# Patient Record
Sex: Male | Born: 1960
Health system: Southern US, Community
[De-identification: ages and names within clinical notes are randomized; demographics above are authoritative.]

## PROBLEM LIST (undated history)

## (undated) DIAGNOSIS — Z8619 Personal history of other infectious and parasitic diseases: Secondary | ICD-10-CM

## (undated) DIAGNOSIS — N200 Calculus of kidney: Secondary | ICD-10-CM

## (undated) DIAGNOSIS — F419 Anxiety disorder, unspecified: Secondary | ICD-10-CM

## (undated) DIAGNOSIS — B2790 Infectious mononucleosis, unspecified without complication: Secondary | ICD-10-CM

## (undated) DIAGNOSIS — M549 Dorsalgia, unspecified: Secondary | ICD-10-CM

## (undated) DIAGNOSIS — L409 Psoriasis, unspecified: Secondary | ICD-10-CM

## (undated) DIAGNOSIS — T7840XA Allergy, unspecified, initial encounter: Secondary | ICD-10-CM

## (undated) DIAGNOSIS — K8020 Calculus of gallbladder without cholecystitis without obstruction: Secondary | ICD-10-CM

## (undated) DIAGNOSIS — I10 Essential (primary) hypertension: Secondary | ICD-10-CM

## (undated) DIAGNOSIS — E785 Hyperlipidemia, unspecified: Secondary | ICD-10-CM

## (undated) DIAGNOSIS — R011 Cardiac murmur, unspecified: Secondary | ICD-10-CM

## (undated) HISTORY — DX: Dorsalgia, unspecified: M54.9

## (undated) HISTORY — PX: OTHER SURGICAL HISTORY: SHX169

## (undated) HISTORY — DX: Anxiety disorder, unspecified: F41.9

## (undated) HISTORY — DX: Calculus of gallbladder without cholecystitis without obstruction: K80.20

## (undated) HISTORY — DX: Cardiac murmur, unspecified: R01.1

## (undated) HISTORY — DX: Allergy, unspecified, initial encounter: T78.40XA

## (undated) HISTORY — DX: Calculus of kidney: N20.0

## (undated) HISTORY — DX: Hyperlipidemia, unspecified: E78.5

## (undated) HISTORY — DX: Essential (primary) hypertension: I10

## (undated) HISTORY — DX: Psoriasis, unspecified: L40.9

## (undated) HISTORY — DX: Infectious mononucleosis, unspecified without complication: B27.90

## (undated) HISTORY — DX: Personal history of other infectious and parasitic diseases: Z86.19

---
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
## 2001-04-03 ENCOUNTER — Encounter: Payer: Self-pay | Admitting: Family Medicine

## 2003-05-31 ENCOUNTER — Encounter: Payer: Self-pay | Admitting: Family Medicine

## 2003-05-31 LAB — CONVERTED CEMR LAB
LDL Cholesterol: 164.1 mg/dL
Triglycerides: 72 mg/dL

## 2004-12-14 ENCOUNTER — Emergency Department (HOSPITAL_COMMUNITY): Admission: EM | Admit: 2004-12-14 | Discharge: 2004-12-14 | Payer: Self-pay | Admitting: Family Medicine

## 2010-01-14 ENCOUNTER — Encounter: Payer: Self-pay | Admitting: Family Medicine

## 2010-01-14 DIAGNOSIS — J309 Allergic rhinitis, unspecified: Secondary | ICD-10-CM | POA: Insufficient documentation

## 2010-01-14 DIAGNOSIS — Z9189 Other specified personal risk factors, not elsewhere classified: Secondary | ICD-10-CM | POA: Insufficient documentation

## 2010-01-20 ENCOUNTER — Ambulatory Visit: Payer: Self-pay | Admitting: Family Medicine

## 2010-01-20 DIAGNOSIS — R635 Abnormal weight gain: Secondary | ICD-10-CM

## 2010-01-22 ENCOUNTER — Ambulatory Visit: Payer: Self-pay | Admitting: Family Medicine

## 2010-01-23 LAB — CONVERTED CEMR LAB
AST: 24 units/L (ref 0–37)
CO2: 27 meq/L (ref 19–32)
Chloride: 103 meq/L (ref 96–112)
Cholesterol: 191 mg/dL (ref 0–200)
Creatinine, Ser: 1 mg/dL (ref 0.4–1.5)
HDL: 34.6 mg/dL — ABNORMAL LOW (ref >39.00)
LDL Cholesterol: 129 mg/dL — ABNORMAL HIGH (ref 0–99)
Potassium: 4.6 meq/L (ref 3.5–5.1)
Sodium: 137 meq/L (ref 135–145)
TSH: 1.35 microintl units/mL (ref 0.35–5.50)
Triglycerides: 139 mg/dL (ref 0.0–149.0)

## 2010-05-19 NOTE — Assessment & Plan Note (Signed)
Summary: NEW PT TO EST/CLE   Vital Signs:  Patient profile:   50 year old male Height:      64 inches Weight:      223.75 pounds BMI:     38.55 Temp:     98.4 degrees F oral Pulse rate:   80 / minute Pulse rhythm:   regular BP sitting:   144 / 86  (left arm) Cuff size:   large  Vitals Entered By: Delilah Shan CMA Maeli Spacek Dull) (January 20, 2010 9:43 AM) CC: New Patient to Establish   History of Present Illness: New to clinic.  H/o allergic rhinitis.  Now off nasonex and doing well.  Taking allegra with relief.  Prev was on claritin.    Inc weight in spite of exercise and diet control.  Weight up about 16lbs in last year.  Asking for advice.  See plan.   Preventive Screening-Counseling & Management  Caffeine-Diet-Exercise     Does Patient Exercise: yes      Drug Use:  no.    Current Medications (verified): 1)  Allegra Allergy 180 Mg Tabs (Fexofenadine Hcl) .... Once Daily As Needed 2)  Aspirin 81 Mg  Tabs (Aspirin) .... Take 1 Tablet By Mouth Once A Day 3)  Multivitamins   Tabs (Multiple Vitamin) .... Take 1 Tablet By Mouth Once A Day  Allergies (verified): 1)  * Nasonex  Past History:  Past Medical History: Last updated: 01/14/2010 CHICKENPOX, HX OF (ICD-V15.9) ALLERGIC RHINITIS (ICD-477.9) 1981  Hospitalized for Mono  Family History: Last updated: 01/20/2010 Father: alive, h/o seizure-single event in distant past, HLD, possible early dementia, h/o cancer (throat) Mother: alive, HTN, arthritis Siblings: 2, healthy Paternal grandparents positive for HTN Maternal grandparents positive for MI < 65 No diabetes, prostate or colon cancer. 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  former Visual merchandiser  Past Surgical History: 1967  Tonsils and Adenoids  Family History: Reviewed history from 01/14/2010 and no changes required. Father: alive, h/o seizure-single event in distant past, HLD, possible early dementia, h/o cancer (throat) Mother: alive, HTN, arthritis Siblings: 2, healthy Paternal grandparents positive for HTN Maternal grandparents positive for MI < 65 No diabetes, prostate or colon cancer. Family History of Alcoholism/Addiction, grandparents Family History of Arthritis, parents, grandparents Family History Diabetes 1st degree relative, other blood relative Family History High cholesterol, parents, grandparents Family History Hypertension, parents Family History of Sudden Death, grandparents Family History of Heart Disease, grandparents  Social History: Reviewed history from 01/14/2010 and no changes required. 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  Otherwise negative.    Physical Exam  General:  GEN: nad, alert and oriented HEENT: mucous membranes moist,  nasal exam wnl NECK: supple w/o LA, no TMG CV: rrr.  no murmur PULM: ctab, no inc wob ABD: soft, +bs EXT: no edema SKIN: no acute rash    Impression & Recommendations:  Problem # 1:  WEIGHT GAIN (ICD-783.1) Check basic labs and contact patient.  Continue exercise and  incoporate more cardio.  Continue to work on diet.   Problem # 2:  ALLERGIC RHINITIS (ICD-477.9) No change in meds other than stopping steroid since he is well controlled off this.  The following medications were removed from the medication list:    Nasacort Aq 55 Mcg/act Aers (Triamcinolone acetonide) .Marland Kitchen... 2 sprays each nostril once daily His updated medication list for this problem includes:    Allegra Allergy 180 Mg Tabs (Fexofenadine hcl) ..... Once daily as needed  Complete Medication List: 1)  Allegra Allergy 180 Mg Tabs (Fexofenadine hcl) .... Once daily as needed 2)  Aspirin 81 Mg Tabs (Aspirin) .... Take 1 tablet by mouth once a day 3)  Multivitamins Tabs (Multiple vitamin) .... Take 1 tablet by mouth once a day  Patient Instructions: 1)  Please come back for fasting labs.  2)  cmet/lipid/TSH-----783.1 3)  You can get your results through our phone system.  Follow the instructions on the blue card.  4)  I would plan on getting a physical next year in the summer.  5)  Glad to see you.  Take care.  Keep exercising, especially cardio.  Prescriptions: ALLEGRA ALLERGY 180 MG TABS (FEXOFENADINE HCL) once daily as needed  #90 x 3   Entered and Authorized by:   Crawford Givens MD   Signed by:   Crawford Givens MD on 01/20/2010   Method used:   Faxed to ...       [facility] (retail)       [address]       [place], [place]  [address]       Ph: [phone]       Fax: [phone]   RxID:   [id number]   Current Allergies (reviewed today): * NASONEX

## 2010-08-24 ENCOUNTER — Ambulatory Visit (INDEPENDENT_AMBULATORY_CARE_PROVIDER_SITE_OTHER): Payer: BC Managed Care – PPO | Admitting: Family Medicine

## 2010-08-24 ENCOUNTER — Encounter: Payer: Self-pay | Admitting: Family Medicine

## 2010-08-24 DIAGNOSIS — R209 Unspecified disturbances of skin sensation: Secondary | ICD-10-CM

## 2010-08-24 DIAGNOSIS — R200 Anesthesia of skin: Secondary | ICD-10-CM

## 2010-08-24 DIAGNOSIS — J069 Acute upper respiratory infection, unspecified: Secondary | ICD-10-CM

## 2010-08-24 DIAGNOSIS — J029 Acute pharyngitis, unspecified: Secondary | ICD-10-CM

## 2010-08-24 NOTE — Assessment & Plan Note (Signed)
Pt to call ortho about fu on this.  I don't think this is related to the URI (and there is no indication of gillian barre).

## 2010-08-24 NOTE — Assessment & Plan Note (Addendum)
Benign exam.   Likely viral.  Should resolved.  Supportive tx and fu prn.  Nontoxic.  No indication for abx.

## 2010-08-24 NOTE — Progress Notes (Signed)
duration of symptoms: Last Monday had a sick exposure.  Dry cough since Thursday.  Sunday, the fever got worse along with chills.  Was some better this AM but then had some subjective fevers again.   rhinorrhea:no congestion:no ear pain:no sore throat:minimal, probably from the cough cough:yes Myalgias:yes No measured temps.  Had some nausea that got better with ginger ale.   He had some numbness in L foot.  He is going through acupuncture with ortho for his L hamstring pain.  ROS: See HPI.  Otherwise negative.    Meds, vitals, and allergies reviewed.   GEN: nad, alert and oriented HEENT: mucous membranes moist, TM w/o erythema, minimal L SOM noted, nasal epithelium not injected, OP with cobblestoning and mild erythema NECK: supple w/o LA CV: rrr. PULM: ctab, no inc wob ABD: soft, +bs EXT: no edema L foot with mild dec in sensation to light tough and monofilament but normal motor and vasc exam  RST negative.

## 2010-08-24 NOTE — Patient Instructions (Signed)
Call ortho about the numbness in your foot.  Drink plenty of fluids, take tylenol as needed, and gargle with warm salt water for your throat.  This should gradually improve.  Take care.  Let us know if you have other concerns.

## 2010-11-16 ENCOUNTER — Ambulatory Visit (INDEPENDENT_AMBULATORY_CARE_PROVIDER_SITE_OTHER): Payer: BC Managed Care – PPO | Admitting: Family Medicine

## 2010-11-16 ENCOUNTER — Encounter: Payer: Self-pay | Admitting: Family Medicine

## 2010-11-16 VITALS — BP 132/80 | HR 98 | Temp 98.2°F | Wt 204.0 lb

## 2010-11-16 DIAGNOSIS — M5432 Sciatica, left side: Secondary | ICD-10-CM

## 2010-11-16 DIAGNOSIS — M5430 Sciatica, unspecified side: Secondary | ICD-10-CM

## 2010-11-16 DIAGNOSIS — M549 Dorsalgia, unspecified: Secondary | ICD-10-CM

## 2010-11-16 MED ORDER — HYDROCODONE-ACETAMINOPHEN 5-500 MG PO TABS: 1.0000 | ORAL_TABLET | Freq: Four times a day (QID) | ORAL | Status: DC | PRN

## 2010-11-16 MED ORDER — IBUPROFEN 200 MG PO TABS: 400.0000 mg | ORAL_TABLET | Freq: Four times a day (QID) | ORAL | Status: AC | PRN

## 2010-11-16 NOTE — Patient Instructions (Signed)
Take 400-600mg  of ibuprofen 4 times a day with food.  Take the vicodin 4 times a day- it can make you drowsy.  Shirlee Limerick will call you about the MRI and the referral.   If you have a significant change in your symptoms, then notify the clinic (loss of bower/bladder control, profound weakness)

## 2010-11-16 NOTE — Assessment & Plan Note (Signed)
With sciatica and weakness in L foot.  DTRs are preserved in the BLE x2.  Refer for MRI and then eval at spine clinic.  Anatomy and rationale d/w pt.  He understood.

## 2010-11-16 NOTE — Progress Notes (Signed)
Back pain going on for about 4 months.  Was getting better but then it got worse when he felt something pop about 2 weeks ago. Had been going to chiropractor with some relief.  Had acupuncture done with some transient relief.  No trauma, falls, wrecks.  L foot has been numb, mostly in 1st toe- that was getting better until the pain increased again in last 2 weeks.  No weakness in legs noted by patient.  Pain shoots down L leg, not in R leg.  No bowel/bladder dysfunction.    He isn't better now than he was 2 weeks ago.    Meds, vitals, and allergies reviewed.   ROS: See HPI.  Otherwise, noncontributory.  nad ncat rrr ctab Back w/o midline pain. L hamstring tight with pos SLR  Quads not weak but he has weakness with plantar and dorsiflexion of the L great toe. Numbness on lateral L shin.  Distally with good cap refill.

## 2010-11-19 ENCOUNTER — Ambulatory Visit
Admission: RE | Admit: 2010-11-19 | Discharge: 2010-11-19 | Disposition: A | Discharge status: Home / Self Care | Payer: BC Managed Care – PPO | Source: Ambulatory Visit | Attending: Family Medicine | Admitting: Family Medicine

## 2010-11-19 DIAGNOSIS — M5432 Sciatica, left side: Secondary | ICD-10-CM

## 2010-11-23 ENCOUNTER — Other Ambulatory Visit: Payer: Self-pay | Admitting: *Deleted

## 2010-11-23 MED ORDER — HYDROCODONE-ACETAMINOPHEN 5-500 MG PO TABS
1.0000 | ORAL_TABLET | Freq: Four times a day (QID) | ORAL | Status: AC | PRN
Start: 2010-11-23 — End: 2010-12-03

## 2010-11-23 NOTE — Telephone Encounter (Signed)
Please call in

## 2010-11-24 NOTE — Telephone Encounter (Signed)
Rx called to pharmacy

## 2011-01-25 ENCOUNTER — Other Ambulatory Visit: Payer: Self-pay | Admitting: *Deleted

## 2011-01-25 NOTE — Telephone Encounter (Signed)
Please get up date from patient.  I didn't have recent correspondence from the spine clinic.  Then I can send the rx in.  Thanks.

## 2011-01-25 NOTE — Telephone Encounter (Signed)
Received faxed refill request.  This medication was not on meds list.  Added for refill purposes.

## 2011-01-25 NOTE — Telephone Encounter (Signed)
Patient said they told him that until he was pretty much debilitated, they cannot do surgery so he will likely have good and bad days until that time.  He says this is what you initially put him on before he went to the spine clinic.  He has an appt with you for a PE next week.

## 2011-01-26 ENCOUNTER — Other Ambulatory Visit: Payer: Self-pay | Admitting: *Deleted

## 2011-01-26 MED ORDER — HYDROCODONE-ACETAMINOPHEN 5-500 MG PO TABS: 1.0000 | ORAL_TABLET | Freq: Four times a day (QID) | ORAL | Status: DC | PRN

## 2011-01-26 NOTE — Telephone Encounter (Signed)
Medication phoned to pharmacy.  

## 2011-01-26 NOTE — Telephone Encounter (Signed)
Duplicate

## 2011-01-26 NOTE — Telephone Encounter (Signed)
Please call in.  Thanks.   

## 2011-01-30 ENCOUNTER — Other Ambulatory Visit: Payer: Self-pay | Admitting: Family Medicine

## 2011-01-30 DIAGNOSIS — R739 Hyperglycemia, unspecified: Secondary | ICD-10-CM

## 2011-02-01 ENCOUNTER — Other Ambulatory Visit (INDEPENDENT_AMBULATORY_CARE_PROVIDER_SITE_OTHER): Payer: BC Managed Care – PPO

## 2011-02-01 DIAGNOSIS — R739 Hyperglycemia, unspecified: Secondary | ICD-10-CM

## 2011-02-01 DIAGNOSIS — R7309 Other abnormal glucose: Secondary | ICD-10-CM

## 2011-02-01 LAB — LIPID PANEL
LDL Cholesterol: 131 mg/dL — ABNORMAL HIGH (ref 0–99)
Total CHOL/HDL Ratio: 5
VLDL: 25.4 mg/dL (ref 0.0–40.0)

## 2011-02-01 LAB — GLUCOSE, RANDOM: Glucose, Bld: 105 mg/dL — ABNORMAL HIGH (ref 70–99)

## 2011-02-05 ENCOUNTER — Encounter: Payer: Self-pay | Admitting: Gastroenterology

## 2011-02-05 ENCOUNTER — Encounter: Payer: Self-pay | Admitting: Family Medicine

## 2011-02-05 ENCOUNTER — Ambulatory Visit (INDEPENDENT_AMBULATORY_CARE_PROVIDER_SITE_OTHER): Payer: BC Managed Care – PPO | Admitting: Family Medicine

## 2011-02-05 VITALS — BP 142/82 | HR 83 | Temp 98.1°F | Wt 211.0 lb

## 2011-02-05 DIAGNOSIS — M549 Dorsalgia, unspecified: Secondary | ICD-10-CM

## 2011-02-05 DIAGNOSIS — Z0000 Encounter for general adult medical examination without abnormal findings: Secondary | ICD-10-CM

## 2011-02-05 DIAGNOSIS — Z1211 Encounter for screening for malignant neoplasm of colon: Secondary | ICD-10-CM

## 2011-02-05 MED ORDER — HYDROCODONE-ACETAMINOPHEN 5-500 MG PO TABS: 1.0000 | ORAL_TABLET | Freq: Four times a day (QID) | ORAL | Status: AC | PRN

## 2011-02-05 NOTE — Assessment & Plan Note (Signed)
PSA options were discussed along with recent recs.  No indication for psa at this point, since patient is low risk and there is no FH of prosate CA.  He declined testing of PSA.  Rectal exam reassuring.   D/w patient [id number] for colon cancer screening, including IFOB vs. colonoscopy.  Risks and benefits of both were discussed and patient voiced understanding.  Pt elects for: colonoscopy.  Will refer.  Flu shot done prev, td up to date.  D/w pt about labs and continue exercise, weight loss.  Minimal hyperglycemia.  He agrees.  Recheck next year.

## 2011-02-05 NOTE — Patient Instructions (Signed)
Take care, keep exercising and see Shirlee Limerick about your referral before your leave today.  Glad to see you today.

## 2011-02-05 NOTE — Assessment & Plan Note (Signed)
Improved, he has rx for vicodin if he has a flare.  He had f/u with spine clinic.  No other intervention now.

## 2011-02-05 NOTE — Progress Notes (Signed)
Back pain is much improved from prev, now with only occ flare. Takes vicodin, 1/2 tab, episodically with a flare.  Hasn't needed it recently.    CPE- See plan.  Routine anticipatory guidance given to patient.  See health maintenance.  PMH and SH reviewed  Meds, vitals, and allergies reviewed.   ROS: See HPI.  Otherwise negative.    GEN: nad, alert and oriented HEENT: mucous membranes moist NECK: supple w/o LA CV: rrr. PULM: ctab, no inc wob ABD: soft, +bs EXT: no edema SKIN: no acute rash Prostate gland firm and smooth, no enlargement, nodularity, tenderness, mass, asymmetry or induration.

## 2011-02-22 ENCOUNTER — Ambulatory Visit (AMBULATORY_SURGERY_CENTER): Payer: BC Managed Care – PPO | Admitting: *Deleted

## 2011-02-22 ENCOUNTER — Encounter: Payer: Self-pay | Admitting: Gastroenterology

## 2011-02-22 VITALS — Ht 64.0 in | Wt 212.0 lb

## 2011-02-22 DIAGNOSIS — Z1211 Encounter for screening for malignant neoplasm of colon: Secondary | ICD-10-CM

## 2011-02-22 MED ORDER — PEG-KCL-NACL-NASULF-NA ASC-C 100 G PO SOLR: 1.0000 | Freq: Once | ORAL | Status: DC

## 2011-03-08 ENCOUNTER — Encounter: Payer: Self-pay | Admitting: Gastroenterology

## 2011-03-08 ENCOUNTER — Ambulatory Visit (AMBULATORY_SURGERY_CENTER): Payer: BC Managed Care – PPO | Admitting: Gastroenterology

## 2011-03-08 DIAGNOSIS — K5730 Diverticulosis of large intestine without perforation or abscess without bleeding: Secondary | ICD-10-CM

## 2011-03-08 DIAGNOSIS — Z1211 Encounter for screening for malignant neoplasm of colon: Secondary | ICD-10-CM

## 2011-03-08 MED ORDER — SODIUM CHLORIDE 0.9 % IV SOLN: 500.0000 mL | INTRAVENOUS | Status: DC

## 2011-03-08 NOTE — Progress Notes (Signed)
Patient did not experience any of the following events: a burn prior to discharge; a fall within the facility; wrong site/side/patient/procedure/implant event; or a hospital transfer or hospital admission upon discharge from the facility. (G8907) Patient did not have preoperative order for IV antibiotic SSI prophylaxis. (G8918)  

## 2011-03-08 NOTE — Patient Instructions (Addendum)
Diverticulosis Diverticulosis is a common condition that develops when small pouches (diverticula) form in the wall of the colon. The risk of diverticulosis increases with age. It happens more often in people who eat a low-fiber diet. Most individuals with diverticulosis have no symptoms. Those individuals with symptoms usually experience abdominal pain, constipation, or loose stools (diarrhea). HOME CARE INSTRUCTIONS   Increase the amount of fiber in your diet as directed by your caregiver or dietician. This may reduce symptoms of diverticulosis.   Your caregiver may recommend taking a dietary fiber supplement.   Drink at least 6 to 8 glasses of water each day to prevent constipation.   Try not to strain when you have a bowel movement.   Your caregiver may recommend avoiding nuts and seeds to prevent complications, although this is still an uncertain benefit.   Only take over-the-counter or prescription medicines for pain, discomfort, or fever as directed by your caregiver.  FOODS WITH HIGH FIBER CONTENT INCLUDE:  Fruits. Apple, peach, pear, tangerine, raisins, prunes.   Vegetables. Brussels sprouts, asparagus, broccoli, cabbage, carrot, cauliflower, romaine lettuce, spinach, summer squash, tomato, winter squash, zucchini.   Starchy Vegetables. Baked beans, kidney beans, lima beans, split peas, lentils, potatoes (with skin).   Grains. Whole wheat bread, brown rice, bran flake cereal, plain oatmeal, white rice, shredded wheat, bran muffins.  SEEK IMMEDIATE MEDICAL CARE IF:   You develop increasing pain or severe bloating.   You have an oral temperature above 102 F (38.9 C), not controlled by medicine.   You develop vomiting or bowel movements that are bloody or black.  Document Released: 01/01/2004 Document Revised: 12/16/2010 Document Reviewed: 09/03/2009 [facility] Patient Information 2012 [place], [place].  Discharge instructions per blue and green sheets Handouts on  diverticulosis and high fiber diets

## 2011-03-09 ENCOUNTER — Telehealth: Payer: Self-pay | Admitting: *Deleted

## 2011-03-09 NOTE — Telephone Encounter (Signed)

## 2011-03-23 ENCOUNTER — Other Ambulatory Visit: Payer: Self-pay | Admitting: *Deleted

## 2011-03-23 MED ORDER — HYDROCODONE-ACETAMINOPHEN 5-500 MG PO TABS: 0.5000 | ORAL_TABLET | Freq: Four times a day (QID) | ORAL | Status: DC | PRN

## 2011-03-23 NOTE — Telephone Encounter (Signed)
Received faxed refill request from pharmacy. Is it okay to refill medication? 

## 2011-03-23 NOTE — Telephone Encounter (Signed)
Please call in

## 2011-03-24 NOTE — Telephone Encounter (Signed)
Medication phoned to pharmacy.  

## 2011-06-24 ENCOUNTER — Other Ambulatory Visit: Payer: Self-pay | Admitting: *Deleted

## 2011-06-25 MED ORDER — HYDROCODONE-ACETAMINOPHEN 5-500 MG PO TABS: 0.5000 | ORAL_TABLET | Freq: Four times a day (QID) | ORAL | Status: DC | PRN

## 2011-06-25 NOTE — Telephone Encounter (Signed)
[facility], patient notified via telephone. 

## 2011-06-25 NOTE — Telephone Encounter (Signed)
Please call in

## 2012-05-13 ENCOUNTER — Other Ambulatory Visit: Payer: Self-pay | Admitting: Family Medicine

## 2012-05-15 MED ORDER — HYDROCODONE-ACETAMINOPHEN 5-325 MG PO TABS: 0.5000 | ORAL_TABLET | Freq: Four times a day (QID) | ORAL | Status: DC | PRN

## 2012-05-15 NOTE — Telephone Encounter (Signed)
Needs to schedule a physical.  Please call in.

## 2012-05-15 NOTE — Telephone Encounter (Signed)
Electronic refill request.  ? If this strength is still available?  Please advise.

## 2012-05-16 NOTE — Telephone Encounter (Signed)
Medication phoned to pharmacy.  LMOVM to schedule CPE.

## 2012-07-03 ENCOUNTER — Other Ambulatory Visit: Payer: Self-pay | Admitting: Family Medicine

## 2012-07-03 DIAGNOSIS — R739 Hyperglycemia, unspecified: Secondary | ICD-10-CM

## 2012-07-04 ENCOUNTER — Other Ambulatory Visit (INDEPENDENT_AMBULATORY_CARE_PROVIDER_SITE_OTHER): Payer: BC Managed Care – PPO

## 2012-07-04 DIAGNOSIS — R7309 Other abnormal glucose: Secondary | ICD-10-CM

## 2012-07-04 DIAGNOSIS — R739 Hyperglycemia, unspecified: Secondary | ICD-10-CM

## 2012-07-04 LAB — LIPID PANEL
Cholesterol: 197 mg/dL (ref 0–200)
HDL: 34 mg/dL — ABNORMAL LOW (ref >39.00)
Total CHOL/HDL Ratio: 6
Triglycerides: 157 mg/dL — ABNORMAL HIGH (ref 0.0–149.0)

## 2012-07-04 LAB — COMPREHENSIVE METABOLIC PANEL
BUN: 17 mg/dL (ref 6–23)
CO2: 26 mEq/L (ref 19–32)
Calcium: 8.8 mg/dL (ref 8.4–10.5)
Creatinine, Ser: 1 mg/dL (ref 0.4–1.5)
GFR: 79.69 mL/min (ref >60.00)
Glucose, Bld: 113 mg/dL — ABNORMAL HIGH (ref 70–99)
Total Bilirubin: 1.1 mg/dL (ref 0.3–1.2)

## 2012-07-11 ENCOUNTER — Encounter: Payer: Self-pay | Admitting: Family Medicine

## 2012-07-11 ENCOUNTER — Ambulatory Visit (INDEPENDENT_AMBULATORY_CARE_PROVIDER_SITE_OTHER): Payer: BC Managed Care – PPO | Admitting: Family Medicine

## 2012-07-11 VITALS — BP 140/80 | HR 87 | Temp 98.6°F | Ht 64.0 in | Wt 219.0 lb

## 2012-07-11 DIAGNOSIS — M549 Dorsalgia, unspecified: Secondary | ICD-10-CM

## 2012-07-11 DIAGNOSIS — Z0000 Encounter for general adult medical examination without abnormal findings: Secondary | ICD-10-CM

## 2012-07-11 NOTE — Progress Notes (Signed)
CPE- See plan.  Routine anticipatory guidance given to patient.  See health maintenance. Tetanus 2009 Flu shot d/w pt.  Done fall 2013.  Colonoscopy 2012 Living will encouraged, d/w pt.  Would have wife designated if incapacitated.   Prostate cancer screening and PSA options (with potential risks and benefits of testing vs not testing) were discussed along with recent recs/guidelines.  He declined testing PSA at this point. Diet and exercise d/w pt.  Going to gym frequently.  Doing tai chi.  His weight is fluctuating.   Mild inc in sugar d/w pt.   Back pain. Taking aleve and occ vicodin.  Known lumbar pathology prev.  Some better with exercise.  He has occ flares.  He doesn't need refill on meds.   Psoriasis on hands and feet per derm.  Doing well.    PMH and SH reviewed  Meds, vitals, and allergies reviewed.   ROS: See HPI.  Otherwise negative.    GEN: nad, alert and oriented HEENT: mucous membranes moist NECK: supple w/o LA CV: rrr. PULM: ctab, no inc wob ABD: soft, +bs EXT: no edema SKIN: no acute rash but psoriatic plaques noted on palms.

## 2012-07-11 NOTE — Patient Instructions (Addendum)
Check diabetes.org about low carb diets.  Keep exercising.  Take care.   I would get a flu shot each fall.   Glad to see you.

## 2012-07-12 NOTE — Assessment & Plan Note (Signed)
Exam for back deferred today.  Doing well with exercise.  Continue as is.  Overall rare flares of pain.

## 2012-07-12 NOTE — Assessment & Plan Note (Signed)
Routine anticipatory guidance given to patient.  See health maintenance. Tetanus 2009 Flu shot d/w pt.  Done fall 2013.  Colonoscopy 2012 Living will encouraged, d/w pt.  Would have wife designated if incapacitated.   Prostate cancer screening and PSA options (with potential risks and benefits of testing vs not testing) were discussed along with recent recs/guidelines.  He declined testing PSA at this point. Diet and exercise d/w pt.  Going to gym frequently.  Doing tai chi.  His weight is fluctuating.   Mild inc in sugar d/w pt.  He'll work on diet.  Continue exercise. Recheck yearly.

## 2012-08-04 ENCOUNTER — Other Ambulatory Visit: Payer: Self-pay | Admitting: Family Medicine

## 2012-08-07 ENCOUNTER — Other Ambulatory Visit: Payer: Self-pay | Admitting: *Deleted

## 2012-08-07 MED ORDER — HYDROCODONE-ACETAMINOPHEN 5-325 MG PO TABS: 0.5000 | ORAL_TABLET | Freq: Four times a day (QID) | ORAL | Status: DC | PRN

## 2012-08-07 NOTE — Telephone Encounter (Signed)
Electronic refill request.  Please advise. 

## 2012-08-07 NOTE — Telephone Encounter (Signed)
Please call in.  Thanks.   

## 2012-08-07 NOTE — Telephone Encounter (Signed)
Faxed refill request.  Last filled 05/16/12  Please advise.

## 2012-08-08 NOTE — Telephone Encounter (Signed)
Rx phoned to pharmacy.  

## 2012-08-21 ENCOUNTER — Encounter: Payer: Self-pay | Admitting: Family Medicine

## 2012-08-21 LAB — GLUCOSE (CC13)
BUN: 22 mg/dL — AB (ref 4–21)
Creatinine, Ser: 0.96
Glucose: 93
HDL: 39 mg/dL (ref 35–70)
LDL Calculated: 121 mg/dL
PSA: 0.6
Uric Acid: 7.2

## 2012-08-25 ENCOUNTER — Encounter: Payer: Self-pay | Admitting: Family Medicine

## 2012-08-25 ENCOUNTER — Ambulatory Visit (INDEPENDENT_AMBULATORY_CARE_PROVIDER_SITE_OTHER): Payer: BC Managed Care – PPO | Admitting: Family Medicine

## 2012-08-25 VITALS — BP 124/70 | HR 87 | Temp 98.5°F | Wt 216.0 lb

## 2012-08-25 DIAGNOSIS — B079 Viral wart, unspecified: Secondary | ICD-10-CM

## 2012-08-25 LAB — HEPATIC FUNCTION PANEL
Albumin: 4.1 g/dL (ref 3.5–5.2)
Bilirubin, Direct: 0.1 mg/dL (ref 0.0–0.3)
Total Bilirubin: 0.8 mg/dL (ref 0.3–1.2)

## 2012-08-25 NOTE — Progress Notes (Signed)
F/u for LFT elevation at work. Had been taking occ vicodin for back aches.  No other med changes. No h/o liver troubles.  No h/o jaundice. No blood transfusions.  No IVDU.  No high risk habits.  He feels well.  No abd pain.  Prev LFTs were okay on checks here.  No NAVD.    Meds, vitals, and allergies reviewed.   ROS: See HPI.  Otherwise, noncontributory.  GEN: nad, alert and oriented HEENT: mucous membranes moist NECK: supple w/o LA CV: rrr.  PULM: ctab, no inc wob ABD: soft, +bs, not ttp in RUQ EXT: no edema SKIN: no acute rash but he does have a wart on the R 3rd finger.

## 2012-08-25 NOTE — Patient Instructions (Addendum)
Go to the lab on the way out.  We'll contact you with your lab report. Take care.  Glad to see you.  

## 2012-08-26 ENCOUNTER — Other Ambulatory Visit: Payer: Self-pay | Admitting: Family Medicine

## 2012-08-27 DIAGNOSIS — B079 Viral wart, unspecified: Secondary | ICD-10-CM | POA: Insufficient documentation

## 2012-08-27 NOTE — Assessment & Plan Note (Signed)
F/u LFTs are fine. Likely a false pos prev. No other w/u needed.

## 2012-08-27 NOTE — Assessment & Plan Note (Signed)
Treated x3 with liq N2 and tolerated well.  F/u prn.  Routine post liq N2 instructions given.

## 2012-08-28 ENCOUNTER — Encounter: Payer: Self-pay | Admitting: *Deleted

## 2012-08-28 NOTE — Telephone Encounter (Signed)
Please call in

## 2012-08-28 NOTE — Telephone Encounter (Signed)
Electronic refill request.  Please advise. 

## 2012-08-28 NOTE — Telephone Encounter (Signed)
Medication phoned to pharmacy.  

## 2013-01-31 ENCOUNTER — Other Ambulatory Visit: Payer: Self-pay

## 2013-01-31 MED ORDER — HYDROCODONE-ACETAMINOPHEN 5-325 MG PO TABS: ORAL_TABLET | ORAL | Status: DC

## 2013-01-31 NOTE — Telephone Encounter (Signed)
Patient notified that script is up front and ready for pickup. 

## 2013-01-31 NOTE — Telephone Encounter (Signed)
Printed.  Thanks.  

## 2013-01-31 NOTE — Telephone Encounter (Signed)
Pt request rx for hydrocodone apap for low back pain. Call when ready for pick up.

## 2013-02-01 ENCOUNTER — Encounter: Payer: Self-pay | Admitting: Family Medicine

## 2013-02-22 ENCOUNTER — Other Ambulatory Visit: Payer: Self-pay

## 2013-02-22 ENCOUNTER — Encounter: Payer: Self-pay | Admitting: Family Medicine

## 2013-06-27 ENCOUNTER — Encounter: Payer: Self-pay | Admitting: Family Medicine

## 2013-09-13 ENCOUNTER — Other Ambulatory Visit: Payer: BC Managed Care – PPO

## 2013-09-17 ENCOUNTER — Other Ambulatory Visit: Payer: BC Managed Care – PPO

## 2013-09-20 ENCOUNTER — Encounter: Payer: BC Managed Care – PPO | Admitting: Family Medicine

## 2013-09-24 ENCOUNTER — Ambulatory Visit (INDEPENDENT_AMBULATORY_CARE_PROVIDER_SITE_OTHER): Payer: BC Managed Care – PPO | Admitting: Family Medicine

## 2013-09-24 ENCOUNTER — Encounter: Payer: Self-pay | Admitting: Family Medicine

## 2013-09-24 VITALS — BP 130/74 | HR 71 | Temp 98.1°F | Ht 64.0 in | Wt 204.0 lb

## 2013-09-24 DIAGNOSIS — L409 Psoriasis, unspecified: Secondary | ICD-10-CM

## 2013-09-24 DIAGNOSIS — Z0000 Encounter for general adult medical examination without abnormal findings: Secondary | ICD-10-CM

## 2013-09-24 DIAGNOSIS — R002 Palpitations: Secondary | ICD-10-CM

## 2013-09-24 DIAGNOSIS — M549 Dorsalgia, unspecified: Secondary | ICD-10-CM

## 2013-09-24 NOTE — Patient Instructions (Signed)
Take care.  Your EKG looks fine.  Glad to see you.  Keep exercising.  If you have more palpitations then let me know.

## 2013-09-24 NOTE — Progress Notes (Signed)
Pre visit review using our clinic review tool, if applicable. No additional management support is needed unless otherwise documented below in the visit note.  CPE- See plan. Routine anticipatory guidance given to patient. See health maintenance.  Tetanus 2009  Flu shot d/w pt. Done fall 2014.  Colonoscopy 2012  Living will encouraged, d/w pt. Would have wife designated if incapacitated.  PSA wnl and reviewed.   Diet and exercise d/w pt. Going to gym frequently. Doing tai chi. His weight is down.  Labs d/w pt.   Back pain. Taking aleve and occ vicodin. Known lumbar pathology prev. Some better with exercise, a lot of core exercises and hamstring stretching.  He has occ flares. He doesn't need refill on meds.   Psoriasis on hands and feet, had seen derm and didn't respond to topical tx.  Didn't want systemic tx.  Discussed.    Palpitations.  Occ noted, rare sx.  No exertional sx, no CP, SOB, BLE edema.  Very limited duration, seconds.  Going on for about 1-2 years.  Not getting worse.   PMH and SH reviewed   Meds, vitals, and allergies reviewed.   ROS: See HPI. Otherwise negative.   GEN: nad, alert and oriented  HEENT: mucous membranes moist  NECK: supple w/o LA  CV: rrr.  PULM: ctab, no inc wob  ABD: soft, +bs  EXT: no edema  SKIN: no acute rash but psoriatic plaques noted on palms.

## 2013-09-26 DIAGNOSIS — R002 Palpitations: Secondary | ICD-10-CM | POA: Insufficient documentation

## 2013-09-26 DIAGNOSIS — L409 Psoriasis, unspecified: Secondary | ICD-10-CM | POA: Insufficient documentation

## 2013-09-26 NOTE — Assessment & Plan Note (Signed)
Routine anticipatory guidance given to patient. See health maintenance.  Tetanus 2009  Flu shot d/w pt. Done fall 2014.  Colonoscopy 2012  Living will encouraged, d/w pt. Would have wife designated if incapacitated.  PSA wnl and reviewed.   Diet and exercise d/w pt. Going to gym frequently. Doing tai chi. His weight is down.  Labs d/w pt.

## 2013-09-26 NOTE — Assessment & Plan Note (Signed)
Taking aleve and occ vicodin. Known lumbar pathology prev. Some better with exercise, a lot of core exercises and hamstring stretching. He has occ flares. He doesn't need refill on meds.  Continue as is.

## 2013-09-26 NOTE — Assessment & Plan Note (Signed)
Psoriasis on hands and feet, had seen derm and didn't respond to topical tx. Didn't want systemic tx. Discussed.  He'll continue as is.

## 2013-09-26 NOTE — Assessment & Plan Note (Signed)
Unremarkable EKG, d/w pt.  With very limited sx, going on for years likely w/o sig problems, then I wouldn't w/u at this point o/w.  Good exertional capacity and can notify me if other concerns.  He agrees.

## 2013-10-08 ENCOUNTER — Other Ambulatory Visit: Payer: Self-pay

## 2013-10-08 NOTE — Telephone Encounter (Signed)
Pt left v/m requesting rx hydrocodone apap. Call when ready for pick up.  

## 2013-10-09 MED ORDER — HYDROCODONE-ACETAMINOPHEN 5-325 MG PO TABS: ORAL_TABLET | ORAL | Status: DC

## 2013-10-09 NOTE — Telephone Encounter (Signed)
Pt notified Rx ready for pickup 

## 2013-10-09 NOTE — Telephone Encounter (Signed)
Printed, thanks

## 2014-04-19 ENCOUNTER — Encounter: Payer: Self-pay | Admitting: Family Medicine

## 2014-04-22 ENCOUNTER — Other Ambulatory Visit: Payer: Self-pay | Admitting: Family Medicine

## 2014-04-22 DIAGNOSIS — R002 Palpitations: Secondary | ICD-10-CM

## 2014-04-22 DIAGNOSIS — Z8249 Family history of ischemic heart disease and other diseases of the circulatory system: Secondary | ICD-10-CM

## 2014-04-26 ENCOUNTER — Other Ambulatory Visit (INDEPENDENT_AMBULATORY_CARE_PROVIDER_SITE_OTHER): Payer: BLUE CROSS/BLUE SHIELD

## 2014-04-26 DIAGNOSIS — Z79899 Other long term (current) drug therapy: Secondary | ICD-10-CM

## 2014-04-26 DIAGNOSIS — R002 Palpitations: Secondary | ICD-10-CM

## 2014-04-26 DIAGNOSIS — Z8249 Family history of ischemic heart disease and other diseases of the circulatory system: Secondary | ICD-10-CM

## 2014-04-26 LAB — CBC WITH DIFFERENTIAL/PLATELET
BASOS ABS: 0 10*3/uL (ref 0.0–0.1)
BASOS PCT: 0.7 % (ref 0.0–3.0)
Eosinophils Absolute: 0.1 10*3/uL (ref 0.0–0.7)
Eosinophils Relative: 1.9 % (ref 0.0–5.0)
HCT: 44.6 % (ref 39.0–52.0)
Hemoglobin: 15.1 g/dL (ref 13.0–17.0)
LYMPHS PCT: 26.3 % (ref 12.0–46.0)
Lymphs Abs: 1.7 10*3/uL (ref 0.7–4.0)
MCHC: 34 g/dL (ref 30.0–36.0)
MCV: 92.1 fl (ref 78.0–100.0)
MONO ABS: 0.5 10*3/uL (ref 0.1–1.0)
Monocytes Relative: 7 % (ref 3.0–12.0)
Neutro Abs: 4.2 10*3/uL (ref 1.4–7.7)
Neutrophils Relative %: 64.1 % (ref 43.0–77.0)
Platelets: 206 10*3/uL (ref 150.0–400.0)
RBC: 4.84 Mil/uL (ref 4.22–5.81)
RDW: 13.6 % (ref 11.5–15.5)
WBC: 6.5 10*3/uL (ref 4.0–10.5)

## 2014-04-26 LAB — COMPREHENSIVE METABOLIC PANEL
ALBUMIN: 4.3 g/dL (ref 3.5–5.2)
ALT: 25 U/L (ref 0–53)
AST: 19 U/L (ref 0–37)
Alkaline Phosphatase: 46 U/L (ref 39–117)
BILIRUBIN TOTAL: 1.1 mg/dL (ref 0.2–1.2)
BUN: 17 mg/dL (ref 6–23)
CO2: 27 mEq/L (ref 19–32)
Calcium: 9.2 mg/dL (ref 8.4–10.5)
Chloride: 105 mEq/L (ref 96–112)
Creatinine, Ser: 1 mg/dL (ref 0.4–1.5)
GFR: 81.86 mL/min (ref >60.00)
GLUCOSE: 109 mg/dL — AB (ref 70–99)
POTASSIUM: 4.5 meq/L (ref 3.5–5.1)
Sodium: 139 mEq/L (ref 135–145)
TOTAL PROTEIN: 7.3 g/dL (ref 6.0–8.3)

## 2014-04-26 LAB — LIPID PANEL
CHOL/HDL RATIO: 6
CHOLESTEROL: 241 mg/dL — AB (ref 0–200)
HDL: 38.8 mg/dL — AB (ref >39.00)
LDL Cholesterol: 181 mg/dL — ABNORMAL HIGH (ref 0–99)
NonHDL: 202.2
Triglycerides: 107 mg/dL (ref 0.0–149.0)
VLDL: 21.4 mg/dL (ref 0.0–40.0)

## 2014-04-26 LAB — TSH: TSH: 1.61 u[IU]/mL (ref 0.35–4.50)

## 2014-04-28 ENCOUNTER — Other Ambulatory Visit: Payer: Self-pay | Admitting: Family Medicine

## 2014-04-28 DIAGNOSIS — R7309 Other abnormal glucose: Secondary | ICD-10-CM

## 2014-04-28 DIAGNOSIS — E785 Hyperlipidemia, unspecified: Secondary | ICD-10-CM

## 2014-08-05 ENCOUNTER — Encounter: Payer: Self-pay | Admitting: Family Medicine

## 2014-08-05 LAB — GLUCOSE (CC13)
CHOLESTEROL, TOTAL: 187
Creatinine, Ser: 0.99 mg/dL (ref 0.50–1.10)
Glucose: 104
HDL: 46 mg/dL (ref 35–70)
LDL CALC: 123
PSA: 0.8
Triglycerides: 92
URIC ACID: 8.2

## 2014-08-28 ENCOUNTER — Encounter: Payer: Self-pay | Admitting: Family Medicine

## 2014-09-19 ENCOUNTER — Other Ambulatory Visit: Payer: Self-pay

## 2014-09-26 ENCOUNTER — Encounter: Payer: Self-pay | Admitting: Family Medicine

## 2014-10-03 ENCOUNTER — Encounter: Payer: Self-pay | Admitting: Family Medicine

## 2014-10-03 ENCOUNTER — Ambulatory Visit (INDEPENDENT_AMBULATORY_CARE_PROVIDER_SITE_OTHER): Payer: BLUE CROSS/BLUE SHIELD | Admitting: Family Medicine

## 2014-10-03 VITALS — BP 118/72 | HR 73 | Temp 98.8°F | Ht 64.0 in | Wt 203.0 lb

## 2014-10-03 DIAGNOSIS — Z0000 Encounter for general adult medical examination without abnormal findings: Secondary | ICD-10-CM | POA: Diagnosis not present

## 2014-10-03 DIAGNOSIS — L409 Psoriasis, unspecified: Secondary | ICD-10-CM

## 2014-10-03 DIAGNOSIS — M545 Low back pain: Secondary | ICD-10-CM

## 2014-10-03 MED ORDER — HYDROCODONE-ACETAMINOPHEN 5-325 MG PO TABS: ORAL_TABLET | ORAL | Status: DC

## 2014-10-03 NOTE — Progress Notes (Signed)
Pre visit review using our clinic review tool, if applicable. No additional management support is needed unless otherwise documented below in the visit note.  CPE- See plan.  Routine anticipatory guidance given to patient.  See health maintenance. Tetanus 2009  Flu shot done prev Colonoscopy 2012  Living will encouraged, d/w pt. Would have wife designated if incapacitated.  PSA wnl and reviewed.  Diet and exercise d/w pt. Going to gym frequently. Doing tai chi. His weight is down overall.   Labs d/w pt.   Psoriasis with joint involvement, skin and joint sx much improved with enbrel.     Back pain. Taking tumeric and occ vicodin. Known lumbar pathology prev. Some better with exercise, a lot of core exercises and hamstring stretching. He has occ flares. He needs refill on med.    PMH and SH reviewed  Meds, vitals, and allergies reviewed.   ROS: See HPI.  Otherwise negative.    GEN: nad, alert and oriented HEENT: mucous membranes moist NECK: supple w/o LA CV: rrr. PULM: ctab, no inc wob ABD: soft, +bs EXT: no edema SKIN: no acute rash

## 2014-10-03 NOTE — Patient Instructions (Signed)
Take care.  Glad to see you.  Recheck in about 1 year.   

## 2014-10-04 NOTE — Assessment & Plan Note (Signed)
With joint involvement, skin and joint sx much improved with enbrel.

## 2014-10-04 NOTE — Assessment & Plan Note (Signed)
Routine anticipatory guidance given to patient. See health maintenance.  Tetanus 2009  Flu shot done prev  Colonoscopy 2012  Living will encouraged, d/w pt. Would have wife designated if incapacitated.  PSA wnl and reviewed.  Diet and exercise d/w pt. Going to gym frequently. Doing tai chi. His weight is down overall.  Labs d/w pt.

## 2014-10-04 NOTE — Assessment & Plan Note (Signed)
Continue stretching and rare use of vicodin.  F/u prn.

## 2015-08-05 LAB — GLUCOSE (CC13)
ALT: 14
AST: 20 U/L
CREATININE: 0.9
Cholesterol, Total: 206
Glucose: 97
HDL Cholesterol: 47
LDL CALC: 129 mg/dL
Prostate Specific Ag, Serum: 0.8
Triglycerides: 150
URIC ACID: 7.9

## 2015-08-12 ENCOUNTER — Encounter: Payer: Self-pay | Admitting: Family Medicine

## 2015-08-12 DIAGNOSIS — L408 Other psoriasis: Secondary | ICD-10-CM | POA: Diagnosis not present

## 2015-08-28 DIAGNOSIS — Z79899 Other long term (current) drug therapy: Secondary | ICD-10-CM | POA: Diagnosis not present

## 2015-10-01 ENCOUNTER — Encounter: Payer: Self-pay | Admitting: Family Medicine

## 2015-10-05 ENCOUNTER — Other Ambulatory Visit: Payer: Self-pay | Admitting: Family Medicine

## 2015-10-05 DIAGNOSIS — R635 Abnormal weight gain: Secondary | ICD-10-CM

## 2015-10-09 ENCOUNTER — Other Ambulatory Visit (INDEPENDENT_AMBULATORY_CARE_PROVIDER_SITE_OTHER): Payer: BLUE CROSS/BLUE SHIELD

## 2015-10-09 DIAGNOSIS — R635 Abnormal weight gain: Secondary | ICD-10-CM

## 2015-10-09 LAB — CBC WITH DIFFERENTIAL/PLATELET
BASOS ABS: 0 10*3/uL (ref 0.0–0.1)
Basophils Relative: 0.9 % (ref 0.0–3.0)
Eosinophils Absolute: 0.1 10*3/uL (ref 0.0–0.7)
Eosinophils Relative: 1.9 % (ref 0.0–5.0)
HCT: 41.5 % (ref 39.0–52.0)
Hemoglobin: 14.5 g/dL (ref 13.0–17.0)
Lymphocytes Relative: 39.2 % (ref 12.0–46.0)
Lymphs Abs: 2 10*3/uL (ref 0.7–4.0)
MCHC: 34.8 g/dL (ref 30.0–36.0)
MCV: 89.9 fl (ref 78.0–100.0)
MONO ABS: 0.4 10*3/uL (ref 0.1–1.0)
Monocytes Relative: 8.3 % (ref 3.0–12.0)
NEUTROS ABS: 2.6 10*3/uL (ref 1.4–7.7)
NEUTROS PCT: 49.7 % (ref 43.0–77.0)
Platelets: 172 10*3/uL (ref 150.0–400.0)
RBC: 4.62 Mil/uL (ref 4.22–5.81)
RDW: 13.1 % (ref 11.5–15.5)
WBC: 5.2 10*3/uL (ref 4.0–10.5)

## 2015-10-09 LAB — COMPREHENSIVE METABOLIC PANEL
ALK PHOS: 37 U/L — AB (ref 39–117)
ALT: 15 U/L (ref 0–53)
AST: 17 U/L (ref 0–37)
Albumin: 4 g/dL (ref 3.5–5.2)
BUN: 16 mg/dL (ref 6–23)
CO2: 30 mEq/L (ref 19–32)
Calcium: 9.3 mg/dL (ref 8.4–10.5)
Chloride: 106 mEq/L (ref 96–112)
Creatinine, Ser: 1.05 mg/dL (ref 0.40–1.50)
GFR: 77.85 mL/min (ref >60.00)
Glucose, Bld: 103 mg/dL — ABNORMAL HIGH (ref 70–99)
POTASSIUM: 4.8 meq/L (ref 3.5–5.1)
Sodium: 135 mEq/L (ref 135–145)
TOTAL PROTEIN: 6.9 g/dL (ref 6.0–8.3)
Total Bilirubin: 0.9 mg/dL (ref 0.2–1.2)

## 2015-10-09 LAB — TSH: TSH: 1.36 u[IU]/mL (ref 0.35–4.50)

## 2015-10-09 LAB — LIPID PANEL
CHOL/HDL RATIO: 5
Cholesterol: 201 mg/dL — ABNORMAL HIGH (ref 0–200)
HDL: 41.3 mg/dL (ref >39.00)
LDL Cholesterol: 135 mg/dL — ABNORMAL HIGH (ref 0–99)
NONHDL: 159.4
Triglycerides: 120 mg/dL (ref 0.0–149.0)
VLDL: 24 mg/dL (ref 0.0–40.0)

## 2015-10-14 ENCOUNTER — Ambulatory Visit (INDEPENDENT_AMBULATORY_CARE_PROVIDER_SITE_OTHER): Payer: BLUE CROSS/BLUE SHIELD | Admitting: Family Medicine

## 2015-10-14 ENCOUNTER — Encounter: Payer: Self-pay | Admitting: Family Medicine

## 2015-10-14 VITALS — BP 116/70 | HR 69 | Temp 98.5°F | Ht 64.0 in | Wt 214.5 lb

## 2015-10-14 DIAGNOSIS — Z0000 Encounter for general adult medical examination without abnormal findings: Secondary | ICD-10-CM

## 2015-10-14 DIAGNOSIS — R635 Abnormal weight gain: Secondary | ICD-10-CM

## 2015-10-14 DIAGNOSIS — M545 Low back pain: Secondary | ICD-10-CM

## 2015-10-14 DIAGNOSIS — L409 Psoriasis, unspecified: Secondary | ICD-10-CM

## 2015-10-14 DIAGNOSIS — Z7189 Other specified counseling: Secondary | ICD-10-CM

## 2015-10-14 NOTE — Progress Notes (Signed)
Pre visit review using our clinic review tool, if applicable. No additional management support is needed unless otherwise documented below in the visit note.  CPE- See plan.  Routine anticipatory guidance given to patient.  See health maintenance. Tetanus 2009  Flu shot done prev PNA and shingles not due.   Colonoscopy 2012  Living will encouraged, d/w pt. Would have wife designated if incapacitated.  Prostate cancer screening and PSA options (with potential risks and benefits of testing vs not testing) were discussed along with recent recs/guidelines.  He declined testing PSA at this point. Diet and exercise d/w pt. Going to gym frequently. Doing tai chi and yoga.  Labs d/w pt.   Weight is up.  Has been calorie counting and trying to go vegetarian.  Weight had been variable over the years.  His weight has varied with his psoriasis over the years.  humira was started about 2 months ago.  Weight gain predates the humira.    Humira per [place] derm.  He is better with humira instead of enbrel.  The joint pain better in the meantime.  HIV and HCV likely done per derm prev before starting treatment.    Back pain. Taking tumeric and occ/rare vicodin. Known lumbar pathology prev. Some better with exercise, a lot of core exercises and hamstring stretching. He has occ flares. He didn't need refill on med.  Doing much better with yoga.  Using compression stockings in the meantime when working in the yard and doing well.    PMH and SH reviewed  Meds, vitals, and allergies reviewed.   ROS: Per HPI.  Unless specifically indicated otherwise in HPI, the patient denies:  General: fever. Eyes: acute vision changes ENT: sore throat Cardiovascular: chest pain Respiratory: SOB GI: vomiting GU: dysuria Musculoskeletal: acute back pain Derm: acute rash Neuro: acute motor dysfunction Psych: worsening mood Endocrine: polydipsia Heme: bleeding Allergy: hayfever  GEN: nad, alert and  oriented HEENT: mucous membranes moist NECK: supple w/o LA CV: rrr. PULM: ctab, no inc wob ABD: soft, +bs EXT: no edema SKIN: no acute rash but minimal psoriatic changes noted.

## 2015-10-14 NOTE — Patient Instructions (Addendum)
Check with dermatology to see if they ran a HIV and HCV test.   Take care.  Glad to see you.  Total your calories and try to cut about 5% Try to level out your calories in the meantime.

## 2015-10-15 DIAGNOSIS — R635 Abnormal weight gain: Secondary | ICD-10-CM | POA: Insufficient documentation

## 2015-10-15 DIAGNOSIS — Z7189 Other specified counseling: Secondary | ICD-10-CM | POA: Insufficient documentation

## 2015-10-15 NOTE — Assessment & Plan Note (Signed)
Tetanus 2009  Flu shot done prev PNA and shingles not due.   Colonoscopy 2012  Living will encouraged, d/w pt. Would have wife designated if incapacitated.  Prostate cancer screening and PSA options (with potential risks and benefits of testing vs not testing) were discussed along with recent recs/guidelines.  He declined testing PSA at this point. Diet and exercise d/w pt. Going to gym frequently. Doing tai chi and yoga.  Labs d/w pt.  HIV and HCV likely done per derm prev before starting treatment.  He'll check on that.

## 2015-10-15 NOTE — Assessment & Plan Note (Signed)
Labs unremarkable.   Total his calories and try to cut about 5% D/w pt rationale to level out the calories in the meantime, as that may help.  Prev with dinner as the biggest meal.   Update me as needed.  No sign of ominous dx.  D/w pt.  He agrees.

## 2015-10-15 NOTE — Assessment & Plan Note (Signed)
Improved with yoga. Continue as is.  Can refill meds later on if needed.  Doing well.

## 2015-10-15 NOTE — Assessment & Plan Note (Signed)
Per derm.  App input.

## 2016-07-27 LAB — GLUCOSE, RANDOM
ALT: 15
AST: 16 U/L
CHOLESTEROL, TOTAL: 225
CREATININE: 0.98
Glucose: 103
HDL Cholesterol: 45
Hemoglobin A1C: 5.2
Hemoglobin: 14
LDL Cholesterol (Calc): 146
Prostate Specific Ag, Serum: 0.8
TRIGLYCERIDES: 168
Uric Acid: 8.3

## 2016-08-12 DIAGNOSIS — L400 Psoriasis vulgaris: Secondary | ICD-10-CM | POA: Diagnosis not present

## 2016-08-12 DIAGNOSIS — Z79899 Other long term (current) drug therapy: Secondary | ICD-10-CM | POA: Diagnosis not present

## 2016-08-13 DIAGNOSIS — Z79899 Other long term (current) drug therapy: Secondary | ICD-10-CM | POA: Diagnosis not present

## 2016-08-16 LAB — QUANTIFERON TB GOLD ASSAY (BLOOD)
HCV Antibodies-RIBA: NONREACTIVE
HIV: NEGATIVE
QUANTIFERON TB GOLD: NEGATIVE

## 2016-08-26 ENCOUNTER — Encounter: Payer: Self-pay | Admitting: Family Medicine

## 2016-09-29 ENCOUNTER — Encounter: Payer: Self-pay | Admitting: Family Medicine

## 2016-10-18 ENCOUNTER — Other Ambulatory Visit: Payer: BLUE CROSS/BLUE SHIELD

## 2016-10-22 ENCOUNTER — Encounter: Payer: Self-pay | Admitting: Family Medicine

## 2016-10-22 ENCOUNTER — Ambulatory Visit (INDEPENDENT_AMBULATORY_CARE_PROVIDER_SITE_OTHER): Payer: BLUE CROSS/BLUE SHIELD | Admitting: Family Medicine

## 2016-10-22 VITALS — BP 126/72 | HR 83 | Temp 98.8°F | Ht 64.0 in | Wt 214.2 lb

## 2016-10-22 DIAGNOSIS — M545 Low back pain: Secondary | ICD-10-CM

## 2016-10-22 DIAGNOSIS — E785 Hyperlipidemia, unspecified: Secondary | ICD-10-CM

## 2016-10-22 DIAGNOSIS — Z0000 Encounter for general adult medical examination without abnormal findings: Secondary | ICD-10-CM

## 2016-10-22 MED ORDER — TRAMADOL HCL 50 MG PO TABS: 50.0000 mg | ORAL_TABLET | Freq: Three times a day (TID) | ORAL | 0 refills | Status: DC | PRN

## 2016-10-22 NOTE — Progress Notes (Signed)
CPE- See plan.  Routine anticipatory guidance given to patient.  See health maintenance.  The possibility exists that previously documented standard health maintenance information may have been brought forward from a previous encounter into this note.  If needed, that same information has been updated to reflect the current situation based on today's encounter.    Tetanus 2009  Flu shot done prev PNA and shingles not due.   Colonoscopy 2012  Living will encouraged, d/w pt. Would have wife designated if incapacitated.  PSA wnl 2018.  Diet and exercise d/w pt. Going to gym frequently. Doing tai chi and yoga.  Labs d/w pt. HLD noted, d/w pt about diet and exercise.  HIV and HCV done per derm prev before starting treatment.    Episodic flares of back pain.  Rare use of pain med, w/o ADE.  Still stretching and exercising at baseline to try to limit back sx in the first place.  D/w pt about trying to change to tramadol.  He agrees. Routine cautions d/w pt.   PMH and SH reviewed  Meds, vitals, and allergies reviewed.   ROS: Per HPI.  Unless specifically indicated otherwise in HPI, the patient denies:  General: fever. Eyes: acute vision changes ENT: sore throat Cardiovascular: chest pain Respiratory: SOB GI: vomiting GU: dysuria Musculoskeletal: acute back pain Derm: acute rash Neuro: acute motor dysfunction Psych: worsening mood Endocrine: polydipsia Heme: bleeding Allergy: hayfever  GEN: nad, alert and oriented HEENT: mucous membranes moist NECK: supple w/o LA CV: rrr. PULM: ctab, no inc wob ABD: soft, +bs EXT: no edema SKIN: no acute rash

## 2016-10-22 NOTE — Patient Instructions (Signed)
Try tramadol for back pain if needed (sedation caution) and keep stretching.  Take care.  Glad to see you.  Update me as needed.

## 2016-10-24 DIAGNOSIS — E785 Hyperlipidemia, unspecified: Secondary | ICD-10-CM | POA: Insufficient documentation

## 2016-10-24 NOTE — Assessment & Plan Note (Signed)
Rare flare overall. He is stretching and doing yoga. He can try tramadol to see if that is effective when needed. Routine cautions given. He agrees.

## 2016-10-24 NOTE — Assessment & Plan Note (Signed)
Labs d/w pt. HLD noted, d/w pt about diet and exercise.  Framingham score approximately 8%. This is likely low enough for him not to start a statin at this point, d/w pt.  We can recheck episodically.

## 2016-10-24 NOTE — Assessment & Plan Note (Signed)
Tetanus 2009  Flu shot done prev PNA and shingles not due.   Colonoscopy 2012  Living will encouraged, d/w pt. Would have wife designated if incapacitated.  PSA wnl 2018.  Diet and exercise d/w pt. Going to gym frequently. Doing tai chi and yoga.  Labs d/w pt. HLD noted, d/w pt about diet and exercise.  HIV and HCV done per derm prev before starting treatment.

## 2016-10-25 ENCOUNTER — Telehealth: Payer: Self-pay | Admitting: *Deleted

## 2016-10-25 NOTE — Telephone Encounter (Signed)
PA for Tramadol submitted and approved by [facility].  [facility] [place] notified.

## 2016-10-26 NOTE — Telephone Encounter (Signed)
PA for Tramadol approved 09/25/16 thru 10/25/17

## 2017-07-05 ENCOUNTER — Other Ambulatory Visit: Payer: Self-pay | Admitting: Family Medicine

## 2017-07-06 NOTE — Telephone Encounter (Signed)
Sent. Thanks.   

## 2017-07-06 NOTE — Telephone Encounter (Signed)
Electronic refill Last refill 10/22/16 #30 Last office visit 10/22/16

## 2017-10-09 ENCOUNTER — Encounter: Payer: Self-pay | Admitting: Family Medicine

## 2017-10-10 NOTE — Telephone Encounter (Signed)
Please advised if additional lab work is needed/ See message from patient.

## 2017-10-19 ENCOUNTER — Other Ambulatory Visit: Payer: BLUE CROSS/BLUE SHIELD

## 2017-10-24 ENCOUNTER — Ambulatory Visit (INDEPENDENT_AMBULATORY_CARE_PROVIDER_SITE_OTHER): Payer: BLUE CROSS/BLUE SHIELD | Admitting: Family Medicine

## 2017-10-24 ENCOUNTER — Encounter: Payer: Self-pay | Admitting: Family Medicine

## 2017-10-24 VITALS — BP 150/72 | HR 90 | Temp 98.5°F | Ht 64.0 in | Wt 221.0 lb

## 2017-10-24 DIAGNOSIS — Z7189 Other specified counseling: Secondary | ICD-10-CM

## 2017-10-24 DIAGNOSIS — Z23 Encounter for immunization: Secondary | ICD-10-CM

## 2017-10-24 DIAGNOSIS — Z0000 Encounter for general adult medical examination without abnormal findings: Secondary | ICD-10-CM

## 2017-10-24 DIAGNOSIS — M545 Low back pain: Secondary | ICD-10-CM

## 2017-10-24 DIAGNOSIS — K8020 Calculus of gallbladder without cholecystitis without obstruction: Secondary | ICD-10-CM

## 2017-10-24 DIAGNOSIS — E785 Hyperlipidemia, unspecified: Secondary | ICD-10-CM

## 2017-10-24 DIAGNOSIS — L409 Psoriasis, unspecified: Secondary | ICD-10-CM

## 2017-10-24 MED ORDER — ATORVASTATIN CALCIUM 10 MG PO TABS: 10.0000 mg | ORAL_TABLET | Freq: Every day | ORAL | 3 refills | Status: DC

## 2017-10-24 NOTE — Progress Notes (Signed)
CPE- See plan.  Routine anticipatory guidance given to patient.  See health maintenance.  The possibility exists that previously documented standard health maintenance information may have been brought forward from a previous encounter into this note.  If needed, that same information has been updated to reflect the current situation based on today's encounter.    Tetanus 2019  Flu shot done prev PNA and shingles d/w pt.   Colonoscopy 2012  Living will encouraged, d/w pt. Would have wife designated if incapacitated.  PSA wnl 2018.  d/w pt about deferring screening in 2019 given neg family FH and prev normal PSA.   Diet and exercise d/w pt. Going to gym frequently. Doing tai chi and yoga.  Labs d/w pt.  HIV and HCV done per derm prev before starting treatment.   Lipids d/w pt. ASCVD score 8.6.  D/w pt about diet/exercise/statin use and rationale.  Routine cautions given.  See plan.  Cholelithiasis incidentally noted on outside imaging.  No symptoms.  Discussed with patient.  No intervention needed unless he had symptoms.  Psoriasis controlled with meds.  No ADE on med. He has some occ aches, but no persistent pain.    Back pain flares treated with stretching and tai chi.  Using inversion table.    He has sig stressors with work and caring for his father.  D/w pt.  BP was lower on outside check prev, d/w pt.    PMH and SH reviewed  Meds, vitals, and allergies reviewed.   ROS: Per HPI.  Unless specifically indicated otherwise in HPI, the patient denies:  General: fever. Eyes: acute vision changes ENT: sore throat Cardiovascular: chest pain Respiratory: SOB GI: vomiting GU: dysuria Musculoskeletal: acute back pain Derm: acute rash Neuro: acute motor dysfunction Psych: worsening mood Endocrine: polydipsia Heme: bleeding Allergy: hayfever  GEN: nad, alert and oriented HEENT: mucous membranes moist NECK: supple w/o LA CV: rrr. PULM: ctab, no inc wob ABD: soft, +bs EXT:  no edema SKIN: no acute rash

## 2017-10-24 NOTE — Patient Instructions (Signed)
Start lipitor 10mg  a day.  If you have aches, stop it and then retry 1/2 tab a day after a break. Update me as needed.  If you can tolerate the medicine, then recheck labs in about 2 months, fasting.  Take care.  Glad to see you.

## 2017-10-25 ENCOUNTER — Encounter: Payer: Self-pay | Admitting: Family Medicine

## 2017-10-25 DIAGNOSIS — K8020 Calculus of gallbladder without cholecystitis without obstruction: Secondary | ICD-10-CM | POA: Insufficient documentation

## 2017-10-25 NOTE — Assessment & Plan Note (Signed)
Per outside clinic.  I will defer. 

## 2017-10-25 NOTE — Assessment & Plan Note (Signed)
Tetanus 2019  Flu shot done prev PNA and shingles d/w pt.   Colonoscopy 2012  Living will encouraged, d/w pt. Would have wife designated if incapacitated.  PSA wnl 2018.  d/w pt about deferring screening in 2019 given neg family FH and prev normal PSA.   Diet and exercise d/w pt. Going to gym frequently. Doing tai chi and yoga.  Labs d/w pt.  HIV and HCV done per derm prev before starting treatment.

## 2017-10-25 NOTE — Assessment & Plan Note (Signed)
No intervention needed since he does not have symptoms.  Discussed with patient.  Only was incidentally noted on outside imaging.

## 2017-10-25 NOTE — Assessment & Plan Note (Signed)
Continue stretching, etc.  Update me as needed.  He agrees.

## 2017-10-25 NOTE — Assessment & Plan Note (Signed)
Wife designated if patient were incapacitated.  

## 2017-10-25 NOTE — Assessment & Plan Note (Signed)
Discussed with patient about ASCVD score, approximately 8.6%.  At this point, reasonable to start statin.  Routine cautions given.  See after visit summary.  Start Lipitor 10 mg a day.  If he has aches then he will stop the medication and retry 5 mg a day.  Recheck labs in about 2 months.  He agrees.

## 2017-10-28 ENCOUNTER — Encounter: Payer: Self-pay | Admitting: Family Medicine

## 2017-10-28 LAB — LAB REPORT - SCANNED
A1c: 5
ALT: 15
AST: 16
CHOLESTEROL: 240
Creatinine, Ser: 1.02
GLUCOSE: 98
HDL: 48
Hemoglobin: 14.9
LDL (calc): 160
TRIGLYCERIDES: 160
TSH: 1.5

## 2018-01-02 ENCOUNTER — Other Ambulatory Visit (INDEPENDENT_AMBULATORY_CARE_PROVIDER_SITE_OTHER): Payer: BLUE CROSS/BLUE SHIELD

## 2018-01-02 DIAGNOSIS — E785 Hyperlipidemia, unspecified: Secondary | ICD-10-CM | POA: Diagnosis not present

## 2018-01-02 LAB — HEPATIC FUNCTION PANEL
ALBUMIN: 4.2 g/dL (ref 3.5–5.2)
ALK PHOS: 47 U/L (ref 39–117)
ALT: 20 U/L (ref 0–53)
AST: 17 U/L (ref 0–37)
BILIRUBIN TOTAL: 1.1 mg/dL (ref 0.2–1.2)
Bilirubin, Direct: 0.2 mg/dL (ref 0.0–0.3)
Total Protein: 7.2 g/dL (ref 6.0–8.3)

## 2018-01-02 LAB — LIPID PANEL
Cholesterol: 131 mg/dL (ref 0–200)
HDL: 39.6 mg/dL (ref >39.00)
LDL Cholesterol: 63 mg/dL (ref 0–99)
NONHDL: 91.7
Total CHOL/HDL Ratio: 3
Triglycerides: 143 mg/dL (ref 0.0–149.0)
VLDL: 28.6 mg/dL (ref 0.0–40.0)

## 2018-01-05 ENCOUNTER — Encounter: Payer: Self-pay | Admitting: *Deleted

## 2018-02-13 DIAGNOSIS — L400 Psoriasis vulgaris: Secondary | ICD-10-CM | POA: Diagnosis not present

## 2018-02-13 DIAGNOSIS — Z79899 Other long term (current) drug therapy: Secondary | ICD-10-CM | POA: Diagnosis not present

## 2018-05-16 DIAGNOSIS — L400 Psoriasis vulgaris: Secondary | ICD-10-CM | POA: Diagnosis not present

## 2018-05-16 DIAGNOSIS — L4050 Arthropathic psoriasis, unspecified: Secondary | ICD-10-CM | POA: Diagnosis not present

## 2018-05-16 DIAGNOSIS — Z79899 Other long term (current) drug therapy: Secondary | ICD-10-CM | POA: Diagnosis not present

## 2018-10-13 ENCOUNTER — Other Ambulatory Visit: Payer: Self-pay | Admitting: Family Medicine

## 2018-10-24 ENCOUNTER — Other Ambulatory Visit: Payer: Self-pay | Admitting: Family Medicine

## 2018-10-24 DIAGNOSIS — Z125 Encounter for screening for malignant neoplasm of prostate: Secondary | ICD-10-CM

## 2018-10-24 DIAGNOSIS — E785 Hyperlipidemia, unspecified: Secondary | ICD-10-CM

## 2018-10-25 ENCOUNTER — Other Ambulatory Visit (INDEPENDENT_AMBULATORY_CARE_PROVIDER_SITE_OTHER): Payer: BC Managed Care – PPO

## 2018-10-25 ENCOUNTER — Other Ambulatory Visit: Payer: Self-pay

## 2018-10-25 DIAGNOSIS — E785 Hyperlipidemia, unspecified: Secondary | ICD-10-CM | POA: Diagnosis not present

## 2018-10-25 DIAGNOSIS — Z125 Encounter for screening for malignant neoplasm of prostate: Secondary | ICD-10-CM | POA: Diagnosis not present

## 2018-10-25 LAB — COMPREHENSIVE METABOLIC PANEL
ALT: 14 U/L (ref 0–53)
AST: 15 U/L (ref 0–37)
Albumin: 4.2 g/dL (ref 3.5–5.2)
Alkaline Phosphatase: 51 U/L (ref 39–117)
BUN: 14 mg/dL (ref 6–23)
CO2: 27 mEq/L (ref 19–32)
Calcium: 8.9 mg/dL (ref 8.4–10.5)
Chloride: 104 mEq/L (ref 96–112)
Creatinine, Ser: 1.08 mg/dL (ref 0.40–1.50)
GFR: 70.13 mL/min (ref >60.00)
Glucose, Bld: 102 mg/dL — ABNORMAL HIGH (ref 70–99)
Potassium: 5.2 mEq/L — ABNORMAL HIGH (ref 3.5–5.1)
Sodium: 139 mEq/L (ref 135–145)
Total Bilirubin: 1.3 mg/dL — ABNORMAL HIGH (ref 0.2–1.2)
Total Protein: 6.8 g/dL (ref 6.0–8.3)

## 2018-10-25 LAB — PSA: PSA: 0.63 ng/mL (ref 0.10–4.00)

## 2018-10-25 LAB — LIPID PANEL
Cholesterol: 134 mg/dL (ref 0–200)
HDL: 37.5 mg/dL — ABNORMAL LOW (ref >39.00)
LDL Cholesterol: 68 mg/dL (ref 0–99)
NonHDL: 96.02
Total CHOL/HDL Ratio: 4
Triglycerides: 139 mg/dL (ref 0.0–149.0)
VLDL: 27.8 mg/dL (ref 0.0–40.0)

## 2018-10-30 ENCOUNTER — Encounter: Payer: Self-pay | Admitting: Family Medicine

## 2018-10-30 ENCOUNTER — Other Ambulatory Visit: Payer: Self-pay

## 2018-10-30 ENCOUNTER — Ambulatory Visit (INDEPENDENT_AMBULATORY_CARE_PROVIDER_SITE_OTHER): Payer: BC Managed Care – PPO | Admitting: Family Medicine

## 2018-10-30 ENCOUNTER — Ambulatory Visit (INDEPENDENT_AMBULATORY_CARE_PROVIDER_SITE_OTHER)
Admission: RE | Admit: 2018-10-30 | Discharge: 2018-10-30 | Disposition: A | Discharge status: Home / Self Care | Payer: BC Managed Care – PPO | Source: Ambulatory Visit | Attending: Family Medicine | Admitting: Family Medicine

## 2018-10-30 VITALS — BP 148/82 | HR 71 | Temp 98.2°F | Ht 64.0 in | Wt 226.6 lb

## 2018-10-30 DIAGNOSIS — Z0000 Encounter for general adult medical examination without abnormal findings: Secondary | ICD-10-CM

## 2018-10-30 DIAGNOSIS — M25559 Pain in unspecified hip: Secondary | ICD-10-CM

## 2018-10-30 DIAGNOSIS — L409 Psoriasis, unspecified: Secondary | ICD-10-CM

## 2018-10-30 DIAGNOSIS — E875 Hyperkalemia: Secondary | ICD-10-CM | POA: Diagnosis not present

## 2018-10-30 DIAGNOSIS — M25551 Pain in right hip: Secondary | ICD-10-CM | POA: Diagnosis not present

## 2018-10-30 DIAGNOSIS — M1990 Unspecified osteoarthritis, unspecified site: Secondary | ICD-10-CM | POA: Diagnosis not present

## 2018-10-30 DIAGNOSIS — M25511 Pain in right shoulder: Secondary | ICD-10-CM | POA: Diagnosis not present

## 2018-10-30 DIAGNOSIS — E785 Hyperlipidemia, unspecified: Secondary | ICD-10-CM

## 2018-10-30 DIAGNOSIS — Z7189 Other specified counseling: Secondary | ICD-10-CM

## 2018-10-30 DIAGNOSIS — M25519 Pain in unspecified shoulder: Secondary | ICD-10-CM | POA: Diagnosis not present

## 2018-10-30 LAB — POTASSIUM: Potassium: 4.6 mEq/L (ref 3.5–5.1)

## 2018-10-30 NOTE — Progress Notes (Signed)
CPE- See plan.  Routine anticipatory guidance given to patient.  See health maintenance.  The possibility exists that previously documented standard health maintenance information may have been brought forward from a previous encounter into this note.  If needed, that same information has been updated to reflect the current situation based on today's encounter.    Tetanus 2019  Flu shot done prev PNA and shingles d/w pt.   Colonoscopy 2012.  FH colon cancer, mother dx'd at 27.   Living will encouraged, d/w pt. Would have wife designated if patient were incapacitated.  PSA wnl 2020 Diet and exercise d/w pt.  Doing tai chi and yoga.  Labs d/w pt.  HIV and HCV done per derm prev before starting treatment.  Elevated Cholesterol: Using medications without problems: yes Muscle aches: see below.   Diet compliance: yes Exercise: yes Labs d/w pt.   Psoriasis with possible psoriatic arthritis.  He has more hip pain in the meantime.  He had been taking ibuprofen for shoulder and hip pain.  We talked about rheum eval.  His hand pain gets better with stelara but not shoulder and hip pain.  R shoulder is more painful than R hip, but he has h/o R shoulder injury from years ago.  Pain on R side at night.  nsaid cautions d/w pt.    Mildly inc K noted, d/w pt. repeat pending.  His father died last year, condolences offered.   PMH and SH reviewed  Meds, vitals, and allergies reviewed.   ROS: Per HPI.  Unless specifically indicated otherwise in HPI, the patient denies:  General: fever. Eyes: acute vision changes ENT: sore throat Cardiovascular: chest pain Respiratory: SOB GI: vomiting GU: dysuria Musculoskeletal: acute back pain Derm: acute rash Neuro: acute motor dysfunction Psych: worsening mood Endocrine: polydipsia Heme: bleeding Allergy: hayfever  GEN: nad, alert and oriented HEENT: mucous membranes moist NECK: supple w/o LA CV: rrr. PULM: ctab, no inc wob ABD: soft,  +bs EXT: no edema SKIN: no acute rash but small psoriatic plaque near R thumb.  Normal R should ROM and cuff testing.  R hip with pain on int rotation.

## 2018-10-30 NOTE — Patient Instructions (Addendum)
We make arrangements for referrals, extra imaging, and other appointments based on the urgency of the situation. Referrals are handled based on the clinical situation, not in the order that they are placed. If you do not see one of our referral coordinators on the way out of the clinic today, then you should expect a call in the next 1 to 2 weeks. We work diligently to process all referrals as quickly as possible.    Stop the lipitor for 2 weeks and update me about the joint aches.   Go to the lab on the way out.  We'll contact you with your lab and xray report. Take care.  Glad to see you.

## 2018-10-31 ENCOUNTER — Encounter: Payer: Self-pay | Admitting: Family Medicine

## 2018-11-01 ENCOUNTER — Telehealth: Payer: Self-pay | Admitting: Family Medicine

## 2018-11-01 NOTE — Assessment & Plan Note (Signed)
Living will encouraged, d/w pt. Would have wife designated if patient were incapacitated.   °

## 2018-11-01 NOTE — Assessment & Plan Note (Signed)
Tetanus 2019  Flu shot done prev PNA and shingles d/w pt.   Colonoscopy 2012.  FH colon cancer, mother dx'd at 38.   Living will encouraged, d/w pt. Would have wife designated if patient were incapacitated.  PSA wnl 2020 Diet and exercise d/w pt.  Doing tai chi and yoga.  Labs d/w pt.  HIV and HCV done per derm prev before starting treatment.

## 2018-11-01 NOTE — Assessment & Plan Note (Signed)
He will stop the lipitor for 2 weeks and update me about the joint aches.  Unclear how much that contributes to his current joint aches.

## 2018-11-01 NOTE — Assessment & Plan Note (Signed)
Unclear how much of his joint aches are related to psoriatic arthritis.  Reasonable to check plain films today and refer to rheumatology.  He agrees.

## 2018-11-01 NOTE — Telephone Encounter (Signed)
Call patient.  I checked on the guidelines about colon cancer screening.  His mother was diagnosed at 39. For screening purposes, patients with one first-degree relative diagnosed with colorectal cancer or advanced adenoma at age 58 years or older are considered at average risk.  This should be the case for him.  That means 10-year screening would still be reasonable.  Thanks.

## 2018-11-02 NOTE — Telephone Encounter (Signed)
Patient advised.

## 2018-11-15 DIAGNOSIS — Z79899 Other long term (current) drug therapy: Secondary | ICD-10-CM | POA: Diagnosis not present

## 2018-11-15 DIAGNOSIS — L400 Psoriasis vulgaris: Secondary | ICD-10-CM | POA: Diagnosis not present

## 2018-11-15 DIAGNOSIS — L4050 Arthropathic psoriasis, unspecified: Secondary | ICD-10-CM | POA: Diagnosis not present

## 2018-11-17 DIAGNOSIS — L4059 Other psoriatic arthropathy: Secondary | ICD-10-CM | POA: Diagnosis not present

## 2018-11-17 DIAGNOSIS — M2550 Pain in unspecified joint: Secondary | ICD-10-CM | POA: Diagnosis not present

## 2018-11-17 DIAGNOSIS — Z6839 Body mass index (BMI) 39.0-39.9, adult: Secondary | ICD-10-CM | POA: Diagnosis not present

## 2018-11-17 LAB — LATEX, IGE: RA Latex Turbid.: <10

## 2018-11-17 LAB — SED RATE MANUAL WEST: SED RATE BY MODIFIED WESTERGREN,MANUAL: 6

## 2018-11-17 LAB — C-REACTIVE PROTEIN: CRP: 2

## 2018-11-17 LAB — CYCLIC CITRUL PEPTIDE ANTIBODY, IGG: CCP Antibodies IgG/IgA: 5

## 2018-11-18 ENCOUNTER — Encounter: Payer: Self-pay | Admitting: Family Medicine

## 2018-11-20 ENCOUNTER — Encounter: Payer: Self-pay | Admitting: Family Medicine

## 2018-11-20 ENCOUNTER — Other Ambulatory Visit: Payer: Self-pay | Admitting: Family Medicine

## 2019-03-06 DIAGNOSIS — L814 Other melanin hyperpigmentation: Secondary | ICD-10-CM | POA: Diagnosis not present

## 2019-03-06 DIAGNOSIS — L820 Inflamed seborrheic keratosis: Secondary | ICD-10-CM | POA: Diagnosis not present

## 2019-03-06 DIAGNOSIS — D485 Neoplasm of uncertain behavior of skin: Secondary | ICD-10-CM | POA: Diagnosis not present

## 2019-03-06 DIAGNOSIS — L821 Other seborrheic keratosis: Secondary | ICD-10-CM | POA: Diagnosis not present

## 2019-03-06 DIAGNOSIS — L400 Psoriasis vulgaris: Secondary | ICD-10-CM | POA: Diagnosis not present

## 2019-03-06 DIAGNOSIS — D2272 Melanocytic nevi of left lower limb, including hip: Secondary | ICD-10-CM | POA: Diagnosis not present

## 2019-05-17 DIAGNOSIS — L409 Psoriasis, unspecified: Secondary | ICD-10-CM | POA: Diagnosis not present

## 2019-05-17 DIAGNOSIS — L4059 Other psoriatic arthropathy: Secondary | ICD-10-CM | POA: Diagnosis not present

## 2019-05-30 DIAGNOSIS — L400 Psoriasis vulgaris: Secondary | ICD-10-CM | POA: Diagnosis not present

## 2019-05-30 DIAGNOSIS — Z23 Encounter for immunization: Secondary | ICD-10-CM | POA: Diagnosis not present

## 2019-05-30 DIAGNOSIS — Z79899 Other long term (current) drug therapy: Secondary | ICD-10-CM | POA: Diagnosis not present

## 2019-07-22 ENCOUNTER — Encounter: Payer: Self-pay | Admitting: Family Medicine

## 2019-07-23 NOTE — Telephone Encounter (Signed)
Dr. Para March, please let him know that his insurance will not cover this medication and it is not cheap.  I will not be doing a PA on it.

## 2019-07-25 ENCOUNTER — Other Ambulatory Visit: Payer: Self-pay | Admitting: Family Medicine

## 2019-07-25 MED ORDER — SILDENAFIL CITRATE 20 MG PO TABS: 20.0000 mg | ORAL_TABLET | Freq: Every day | ORAL | 2 refills | Status: DC | PRN

## 2019-09-13 DIAGNOSIS — M25511 Pain in right shoulder: Secondary | ICD-10-CM | POA: Diagnosis not present

## 2019-09-13 DIAGNOSIS — M545 Low back pain: Secondary | ICD-10-CM | POA: Diagnosis not present

## 2019-09-13 DIAGNOSIS — R269 Unspecified abnormalities of gait and mobility: Secondary | ICD-10-CM | POA: Diagnosis not present

## 2019-09-20 DIAGNOSIS — M545 Low back pain: Secondary | ICD-10-CM | POA: Diagnosis not present

## 2019-09-20 DIAGNOSIS — M25511 Pain in right shoulder: Secondary | ICD-10-CM | POA: Diagnosis not present

## 2019-09-20 DIAGNOSIS — R269 Unspecified abnormalities of gait and mobility: Secondary | ICD-10-CM | POA: Diagnosis not present

## 2019-09-25 DIAGNOSIS — M545 Low back pain: Secondary | ICD-10-CM | POA: Diagnosis not present

## 2019-09-25 DIAGNOSIS — R269 Unspecified abnormalities of gait and mobility: Secondary | ICD-10-CM | POA: Diagnosis not present

## 2019-09-25 DIAGNOSIS — M25511 Pain in right shoulder: Secondary | ICD-10-CM | POA: Diagnosis not present

## 2019-09-27 DIAGNOSIS — M545 Low back pain: Secondary | ICD-10-CM | POA: Diagnosis not present

## 2019-09-27 DIAGNOSIS — R269 Unspecified abnormalities of gait and mobility: Secondary | ICD-10-CM | POA: Diagnosis not present

## 2019-09-27 DIAGNOSIS — M25511 Pain in right shoulder: Secondary | ICD-10-CM | POA: Diagnosis not present

## 2019-10-05 DIAGNOSIS — R269 Unspecified abnormalities of gait and mobility: Secondary | ICD-10-CM | POA: Diagnosis not present

## 2019-10-05 DIAGNOSIS — M545 Low back pain: Secondary | ICD-10-CM | POA: Diagnosis not present

## 2019-10-05 DIAGNOSIS — M25511 Pain in right shoulder: Secondary | ICD-10-CM | POA: Diagnosis not present

## 2019-10-22 ENCOUNTER — Other Ambulatory Visit: Payer: Self-pay | Admitting: Family Medicine

## 2019-10-22 DIAGNOSIS — Z125 Encounter for screening for malignant neoplasm of prostate: Secondary | ICD-10-CM

## 2019-10-22 DIAGNOSIS — E786 Lipoprotein deficiency: Secondary | ICD-10-CM

## 2019-10-26 ENCOUNTER — Other Ambulatory Visit: Payer: Self-pay

## 2019-10-26 ENCOUNTER — Other Ambulatory Visit (INDEPENDENT_AMBULATORY_CARE_PROVIDER_SITE_OTHER): Payer: BC Managed Care – PPO

## 2019-10-26 DIAGNOSIS — E786 Lipoprotein deficiency: Secondary | ICD-10-CM | POA: Diagnosis not present

## 2019-10-26 DIAGNOSIS — Z125 Encounter for screening for malignant neoplasm of prostate: Secondary | ICD-10-CM | POA: Diagnosis not present

## 2019-10-26 LAB — COMPREHENSIVE METABOLIC PANEL
ALT: 13 U/L (ref 0–53)
AST: 14 U/L (ref 0–37)
Albumin: 4.2 g/dL (ref 3.5–5.2)
Alkaline Phosphatase: 45 U/L (ref 39–117)
BUN: 10 mg/dL (ref 6–23)
CO2: 26 mEq/L (ref 19–32)
Calcium: 9 mg/dL (ref 8.4–10.5)
Chloride: 103 mEq/L (ref 96–112)
Creatinine, Ser: 0.97 mg/dL (ref 0.40–1.50)
GFR: 79.12 mL/min (ref >60.00)
Glucose, Bld: 102 mg/dL — ABNORMAL HIGH (ref 70–99)
Potassium: 4.4 mEq/L (ref 3.5–5.1)
Sodium: 137 mEq/L (ref 135–145)
Total Bilirubin: 1 mg/dL (ref 0.2–1.2)
Total Protein: 6.6 g/dL (ref 6.0–8.3)

## 2019-10-26 LAB — LIPID PANEL
Cholesterol: 183 mg/dL (ref 0–200)
HDL: 41.1 mg/dL (ref >39.00)
LDL Cholesterol: 111 mg/dL — ABNORMAL HIGH (ref 0–99)
NonHDL: 141.97
Total CHOL/HDL Ratio: 4
Triglycerides: 153 mg/dL — ABNORMAL HIGH (ref 0.0–149.0)
VLDL: 30.6 mg/dL (ref 0.0–40.0)

## 2019-10-26 LAB — PSA: PSA: 0.62 ng/mL (ref 0.10–4.00)

## 2019-10-30 DIAGNOSIS — M545 Low back pain: Secondary | ICD-10-CM | POA: Diagnosis not present

## 2019-10-30 DIAGNOSIS — R269 Unspecified abnormalities of gait and mobility: Secondary | ICD-10-CM | POA: Diagnosis not present

## 2019-10-30 DIAGNOSIS — M25511 Pain in right shoulder: Secondary | ICD-10-CM | POA: Diagnosis not present

## 2019-11-02 ENCOUNTER — Encounter: Payer: BC Managed Care – PPO | Admitting: Family Medicine

## 2019-11-05 ENCOUNTER — Encounter: Payer: Self-pay | Admitting: Family Medicine

## 2019-11-05 ENCOUNTER — Ambulatory Visit (INDEPENDENT_AMBULATORY_CARE_PROVIDER_SITE_OTHER): Payer: BC Managed Care – PPO | Admitting: Family Medicine

## 2019-11-05 ENCOUNTER — Other Ambulatory Visit: Payer: Self-pay

## 2019-11-05 VITALS — BP 136/72 | HR 94 | Temp 98.0°F | Ht 64.0 in | Wt 229.6 lb

## 2019-11-05 DIAGNOSIS — L409 Psoriasis, unspecified: Secondary | ICD-10-CM

## 2019-11-05 DIAGNOSIS — Z0000 Encounter for general adult medical examination without abnormal findings: Secondary | ICD-10-CM

## 2019-11-05 DIAGNOSIS — Z7189 Other specified counseling: Secondary | ICD-10-CM

## 2019-11-05 DIAGNOSIS — N529 Male erectile dysfunction, unspecified: Secondary | ICD-10-CM

## 2019-11-05 DIAGNOSIS — E785 Hyperlipidemia, unspecified: Secondary | ICD-10-CM

## 2019-11-05 NOTE — Patient Instructions (Signed)
Keep working on diet and exercise.  Update me as needed.  Take care.  Glad to see you. 

## 2019-11-05 NOTE — Progress Notes (Signed)
This visit occurred during the SARS-CoV-2 public health emergency.  Safety protocols were in place, including screening questions prior to the visit, additional usage of staff PPE, and extensive cleaning of exam room while observing appropriate contact time as indicated for disinfecting solutions.  CPE- See plan.  Routine anticipatory guidance given to patient.  See health maintenance.  The possibility exists that previously documented standard health maintenance information may have been brought forward from a previous encounter into this note.  If needed, that same information has been updated to reflect the current situation based on today's encounter.    Tetanus 2019  Flu shot done prev PNA and shingles d/w pt.  covid vaccine 2021 Colonoscopy 2012.  FH colon polyps but not cancer, mother dx'd at 66.   Living will encouraged, d/w pt. Would have wife designated if patient were incapacitated.  PSA wnl 2020 Diet and exercise d/w pt.  Doing tai chi and yoga.  Labs d/w pt.  HIV and HCV done per derm prev before starting treatment.  Psoriasis on stelara.  He was able to get covid vaccine with joint aches for about 1 day that then resolved.  Seeing Dr. Renda Rolls with dermatology.   He clearly did better off lipitor.  D/w pt.  He doesn't have h/o early CAD.  Lipids elevated from pred, now off statin, d/w pt about diet and exercise.    R shoulder better after PT, after a fall this spring.    ED treated with sildenafil without adverse effect.  PMH and SH reviewed  Meds, vitals, and allergies reviewed.   ROS: Per HPI.  Unless specifically indicated otherwise in HPI, the patient denies:  General: fever. Eyes: acute vision changes ENT: sore throat Cardiovascular: chest pain Respiratory: SOB GI: vomiting GU: dysuria Musculoskeletal: acute back pain Derm: acute rash Neuro: acute motor dysfunction Psych: worsening mood Endocrine: polydipsia Heme: bleeding Allergy: hayfever  GEN:  nad, alert and oriented HEENT: ncat NECK: supple w/o LA CV: rrr. PULM: ctab, no inc wob ABD: soft, +bs EXT: no edema SKIN: no acute rash but mild psoriatic changes on the B palms, at the thumbs.

## 2019-11-07 DIAGNOSIS — N529 Male erectile dysfunction, unspecified: Secondary | ICD-10-CM | POA: Insufficient documentation

## 2019-11-07 NOTE — Assessment & Plan Note (Signed)
Seeing Dr. Sharyn Lull with dermatology.  Will send copy of labs as FYI.  Appreciate dermatology input.

## 2019-11-07 NOTE — Assessment & Plan Note (Signed)
ED treated with sildenafil without adverse effect.  Continue as needed use with routine cautions.

## 2019-11-07 NOTE — Assessment & Plan Note (Signed)
Tetanus 2019  Flu shot done prev PNA and shingles d/w pt.  covid vaccine 2021 Colonoscopy 2012.  FH colon polyps but not cancer, mother dx'd at 74.   Living will encouraged, d/w pt. Would have wife designated if patient were incapacitated.  PSA wnl 2020 Diet and exercise d/w pt.  Doing tai chi and yoga.  Labs d/w pt.  HIV and HCV done per derm prev before starting treatment.

## 2019-11-07 NOTE — Assessment & Plan Note (Signed)
Living will encouraged, d/w pt. Would have wife designated if patient were incapacitated.

## 2019-11-07 NOTE — Assessment & Plan Note (Signed)
He clearly did better off lipitor.  D/w pt.  He doesn't have h/o early CAD.  Lipids elevated from pred, now off statin, d/w pt about diet and exercise.

## 2019-11-13 DIAGNOSIS — M25511 Pain in right shoulder: Secondary | ICD-10-CM | POA: Diagnosis not present

## 2019-11-13 DIAGNOSIS — R269 Unspecified abnormalities of gait and mobility: Secondary | ICD-10-CM | POA: Diagnosis not present

## 2019-11-13 DIAGNOSIS — M545 Low back pain: Secondary | ICD-10-CM | POA: Diagnosis not present

## 2019-11-29 DIAGNOSIS — R269 Unspecified abnormalities of gait and mobility: Secondary | ICD-10-CM | POA: Diagnosis not present

## 2019-11-29 DIAGNOSIS — M545 Low back pain: Secondary | ICD-10-CM | POA: Diagnosis not present

## 2019-11-29 DIAGNOSIS — M25511 Pain in right shoulder: Secondary | ICD-10-CM | POA: Diagnosis not present

## 2019-11-30 ENCOUNTER — Encounter: Payer: Self-pay | Admitting: Family Medicine

## 2019-12-04 DIAGNOSIS — L400 Psoriasis vulgaris: Secondary | ICD-10-CM | POA: Diagnosis not present

## 2019-12-04 DIAGNOSIS — Z79899 Other long term (current) drug therapy: Secondary | ICD-10-CM | POA: Diagnosis not present

## 2019-12-20 DIAGNOSIS — M545 Low back pain: Secondary | ICD-10-CM | POA: Diagnosis not present

## 2019-12-20 DIAGNOSIS — M25511 Pain in right shoulder: Secondary | ICD-10-CM | POA: Diagnosis not present

## 2019-12-20 DIAGNOSIS — R269 Unspecified abnormalities of gait and mobility: Secondary | ICD-10-CM | POA: Diagnosis not present

## 2020-03-18 DIAGNOSIS — L821 Other seborrheic keratosis: Secondary | ICD-10-CM | POA: Diagnosis not present

## 2020-03-18 DIAGNOSIS — L400 Psoriasis vulgaris: Secondary | ICD-10-CM | POA: Diagnosis not present

## 2020-03-18 DIAGNOSIS — D225 Melanocytic nevi of trunk: Secondary | ICD-10-CM | POA: Diagnosis not present

## 2020-03-18 DIAGNOSIS — L814 Other melanin hyperpigmentation: Secondary | ICD-10-CM | POA: Diagnosis not present

## 2020-03-19 DIAGNOSIS — Z20822 Contact with and (suspected) exposure to covid-19: Secondary | ICD-10-CM | POA: Diagnosis not present

## 2020-03-31 ENCOUNTER — Telehealth: Payer: Self-pay | Admitting: Family Medicine

## 2020-03-31 NOTE — Telephone Encounter (Signed)
Pt rep from [facility] is calling needing a new prescription for Sildenafil

## 2020-03-31 NOTE — Telephone Encounter (Signed)
Please Advise

## 2020-04-01 MED ORDER — SILDENAFIL CITRATE 20 MG PO TABS: 20.0000 mg | ORAL_TABLET | Freq: Every day | ORAL | 12 refills | Status: DC | PRN

## 2020-04-01 NOTE — Telephone Encounter (Signed)
Sent. Thanks.   

## 2020-04-04 ENCOUNTER — Encounter: Payer: Self-pay | Admitting: Family Medicine

## 2020-04-26 IMAGING — DX DG HIP (WITH OR WITHOUT PELVIS) 2-3V RIGHT
3 series · 3 of 3 positions shown · non-contrast
Comparison: None.

CLINICAL DATA: Right hip pain.

EXAM:
DG HIP (WITH OR WITHOUT PELVIS) 2-3V RIGHT

[pelvis ap]
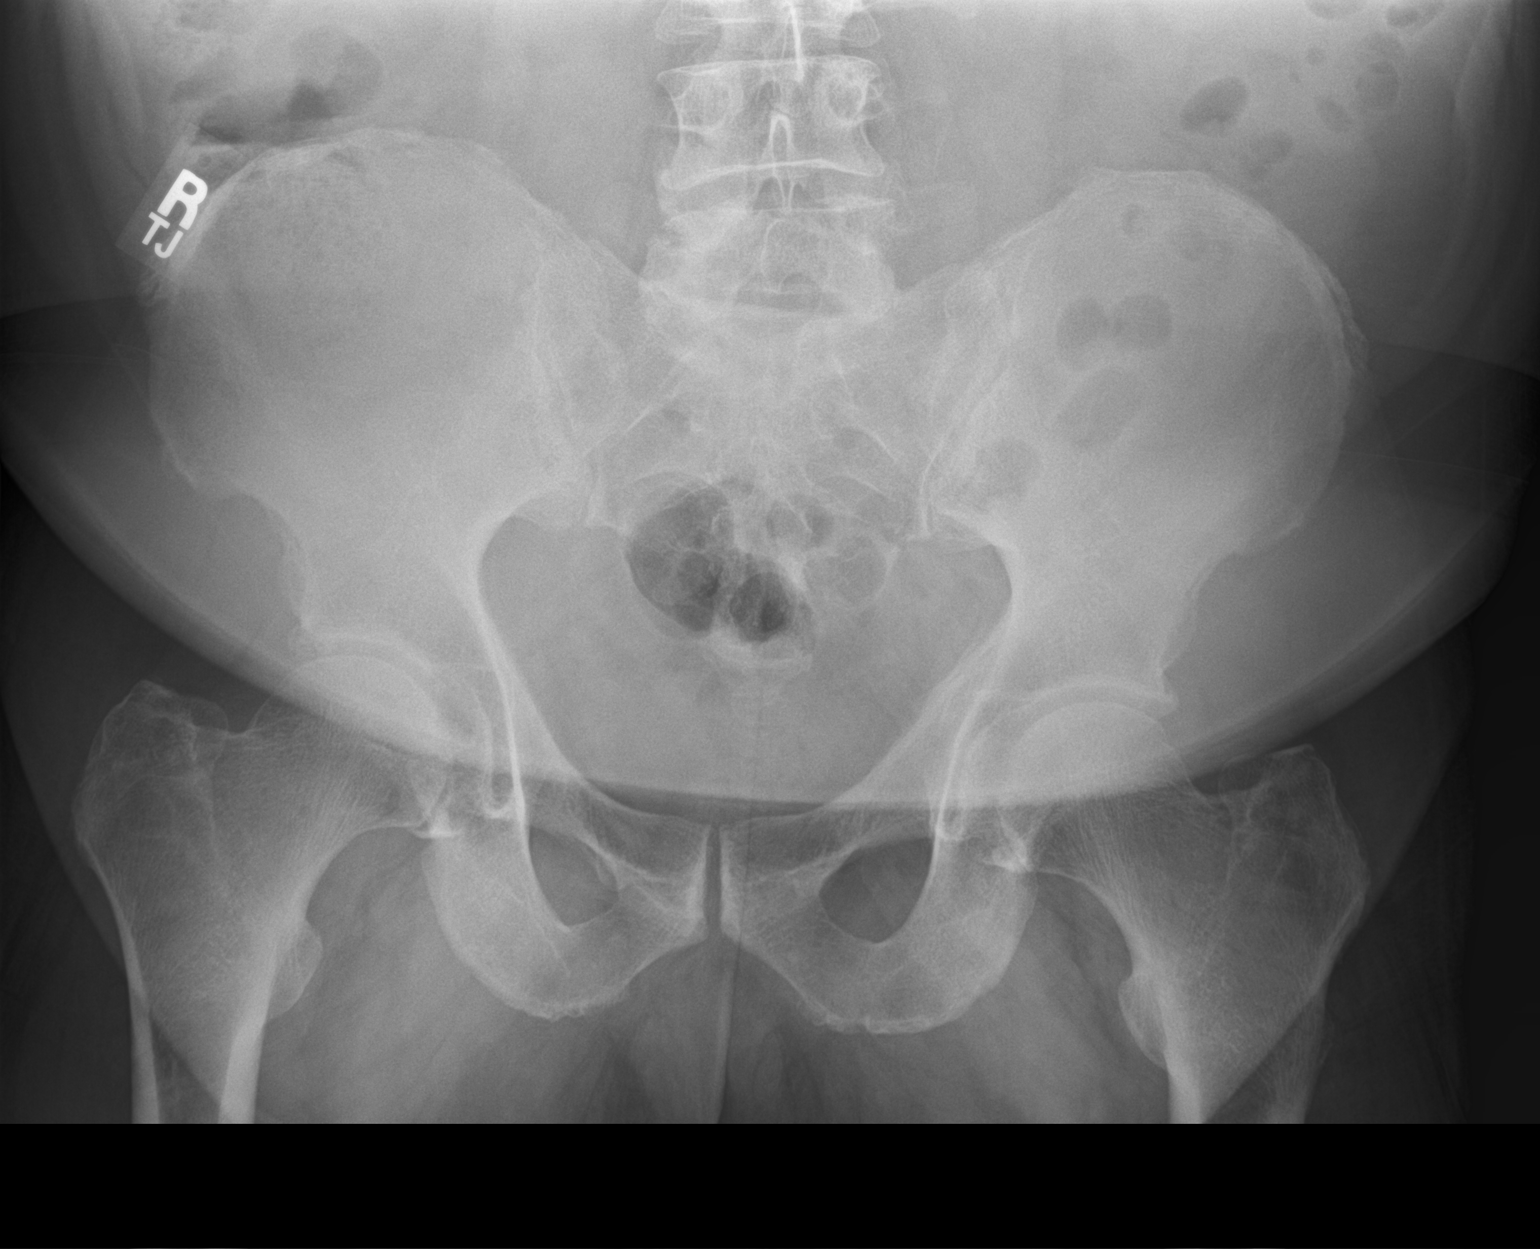

[hip ap]
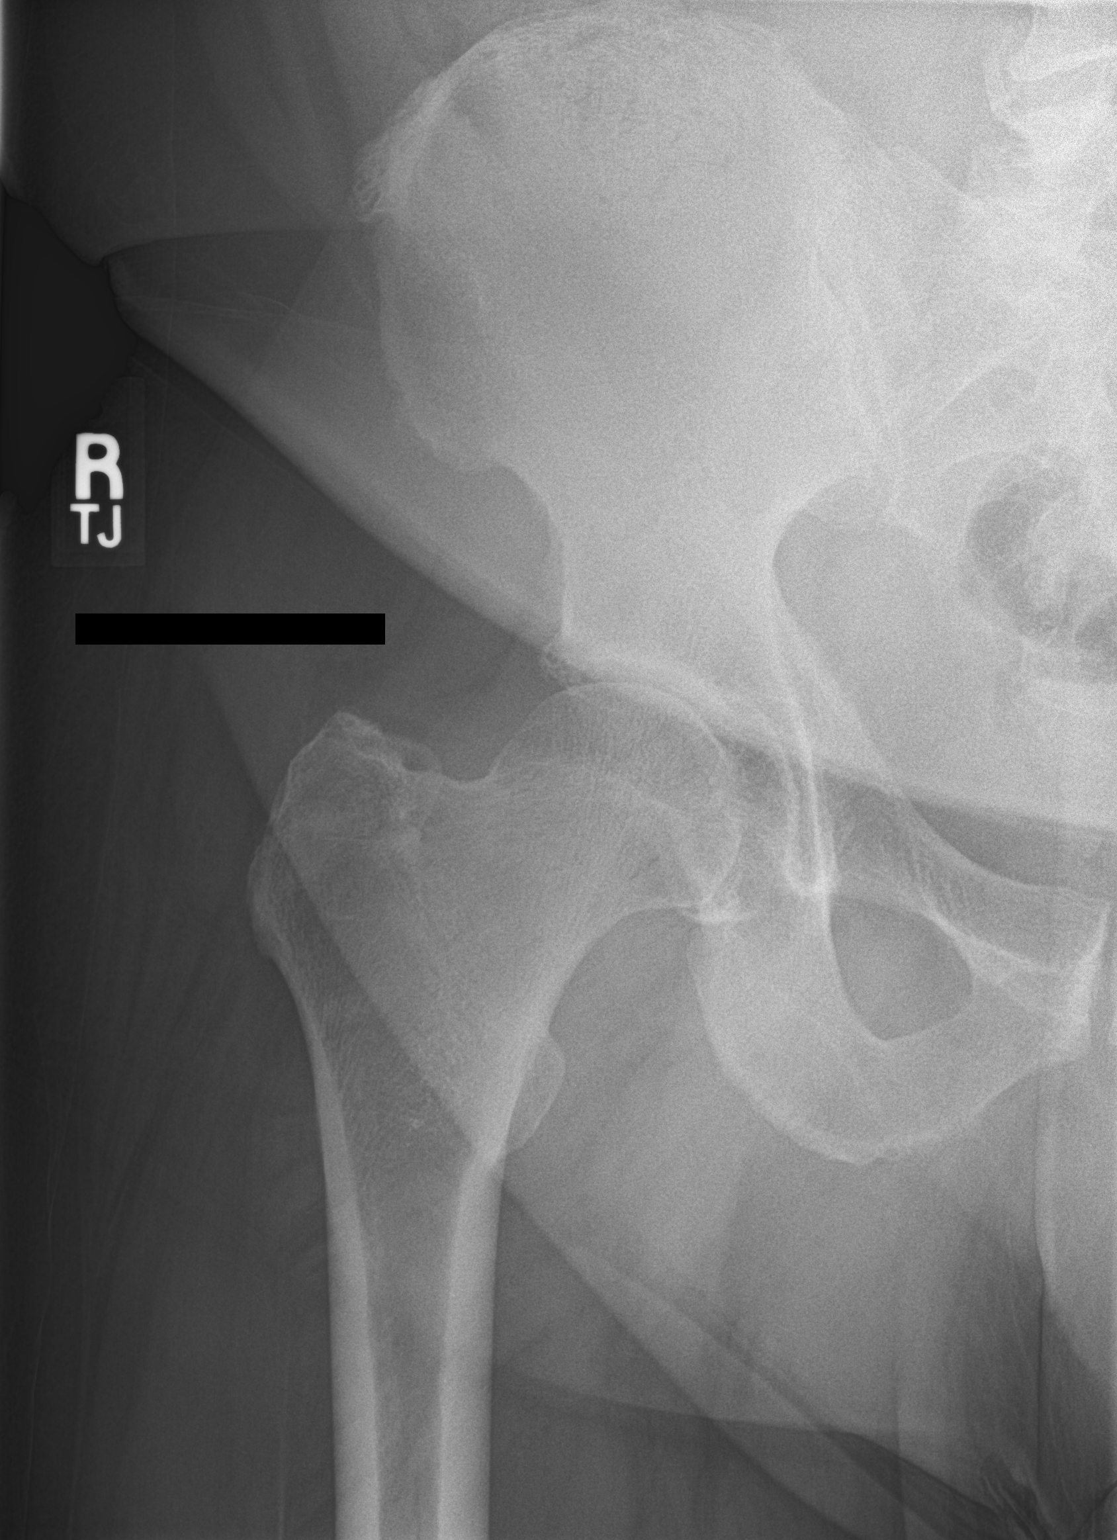

[hip lat]
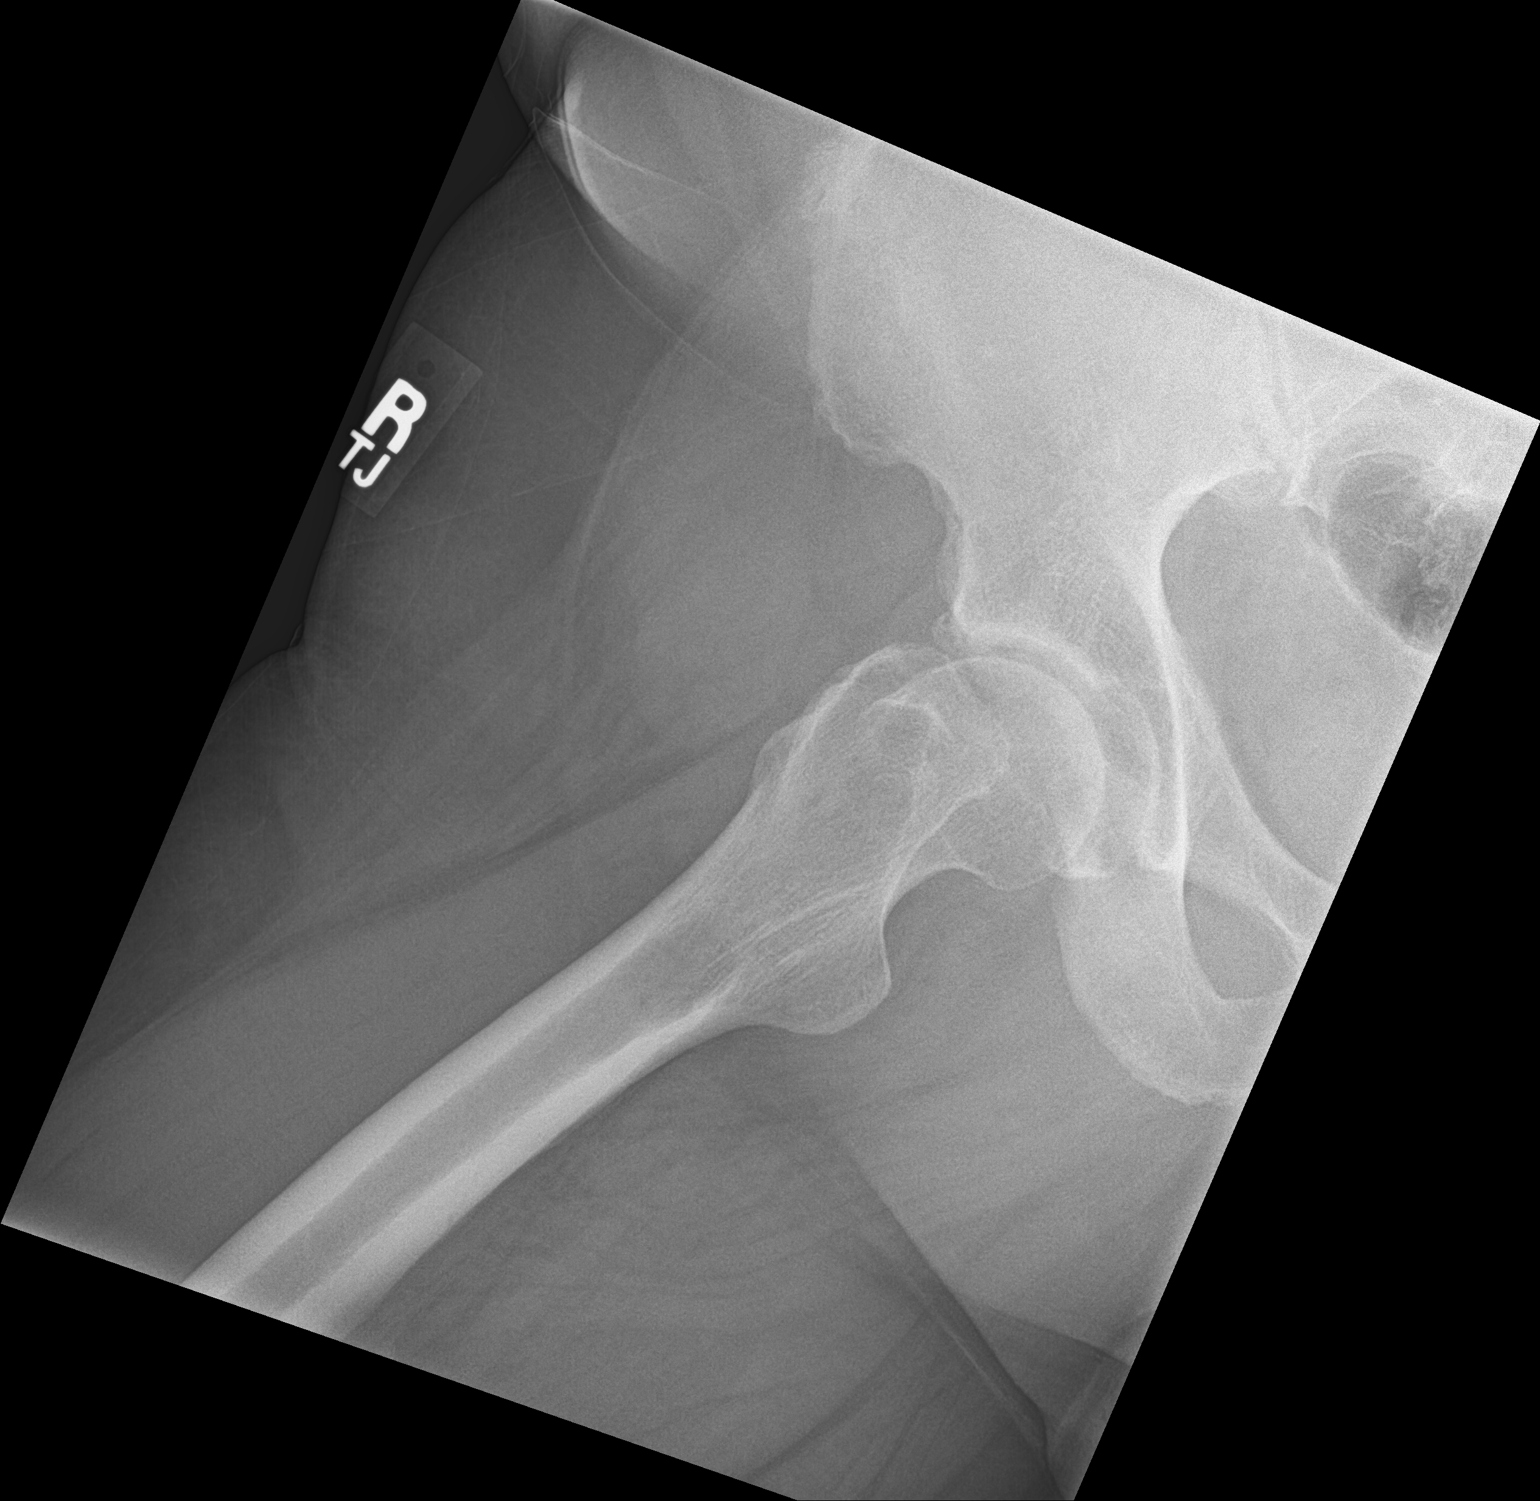

[3 of 3 positions shown; findings below may reference images not displayed]

FINDINGS: There is no evidence of hip fracture or dislocation. There is no
evidence of arthropathy or other focal bone abnormality.
IMPRESSION: Negative.

## 2020-04-26 IMAGING — DX RIGHT SHOULDER - 2+ VIEW
3 series · 3 of 3 positions shown · non-contrast
Comparison: None.

CLINICAL DATA: Right shoulder pain post remote injury.

EXAM:
RIGHT SHOULDER - 2+ VIEW

[shoulder axial]
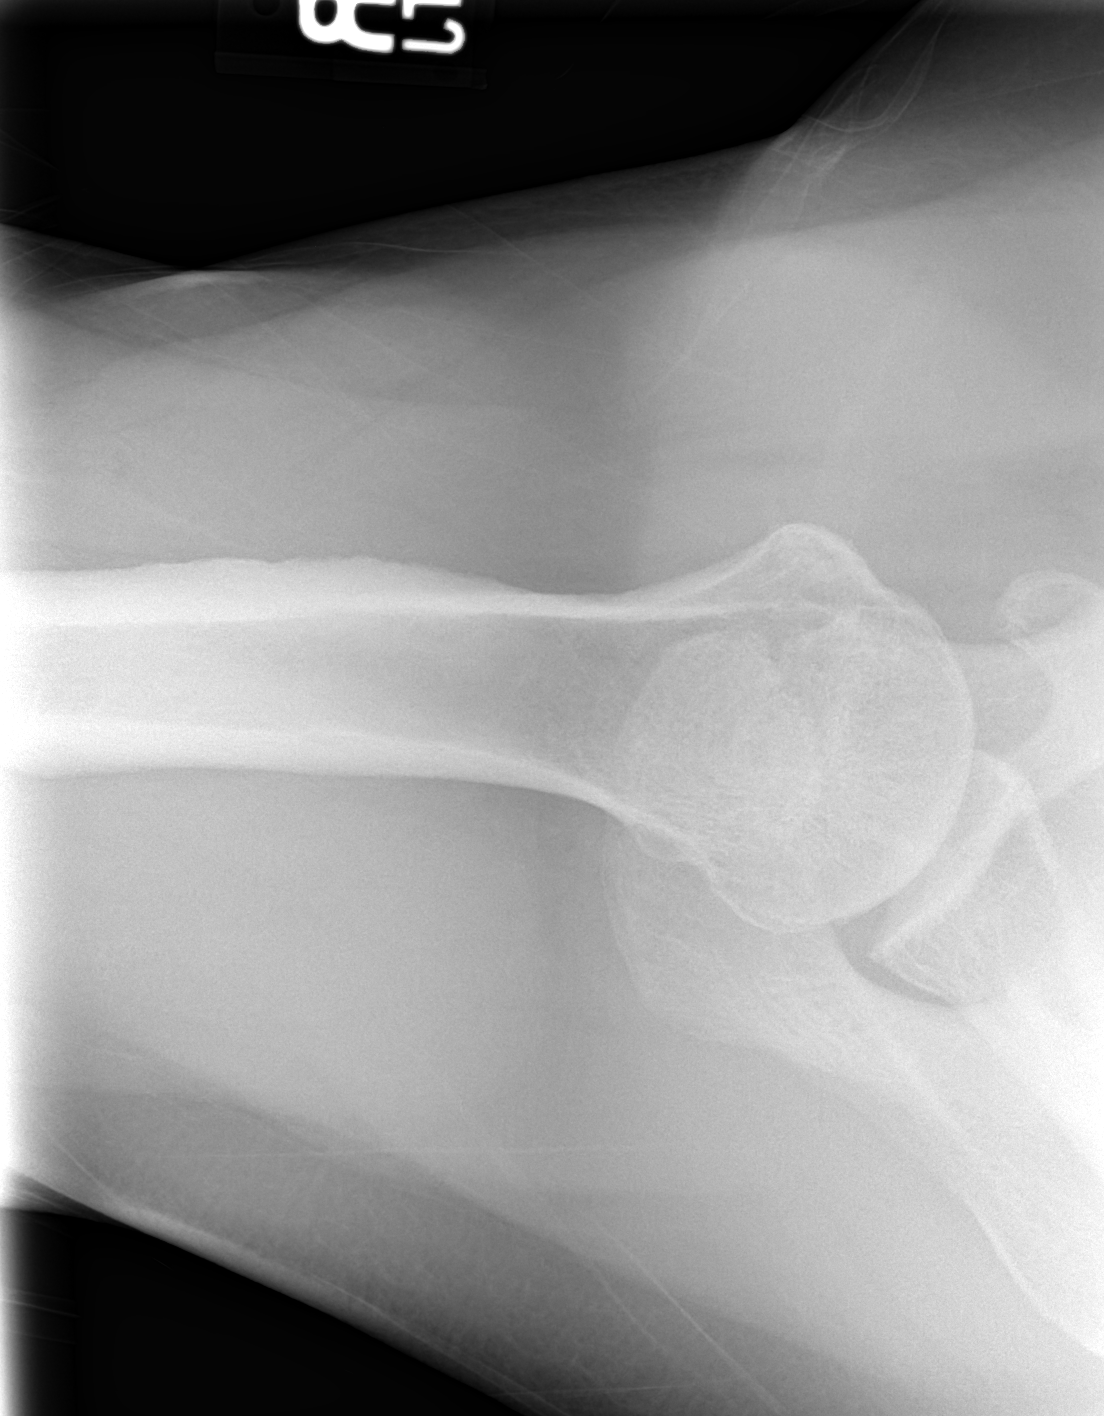

[shoulder obl]
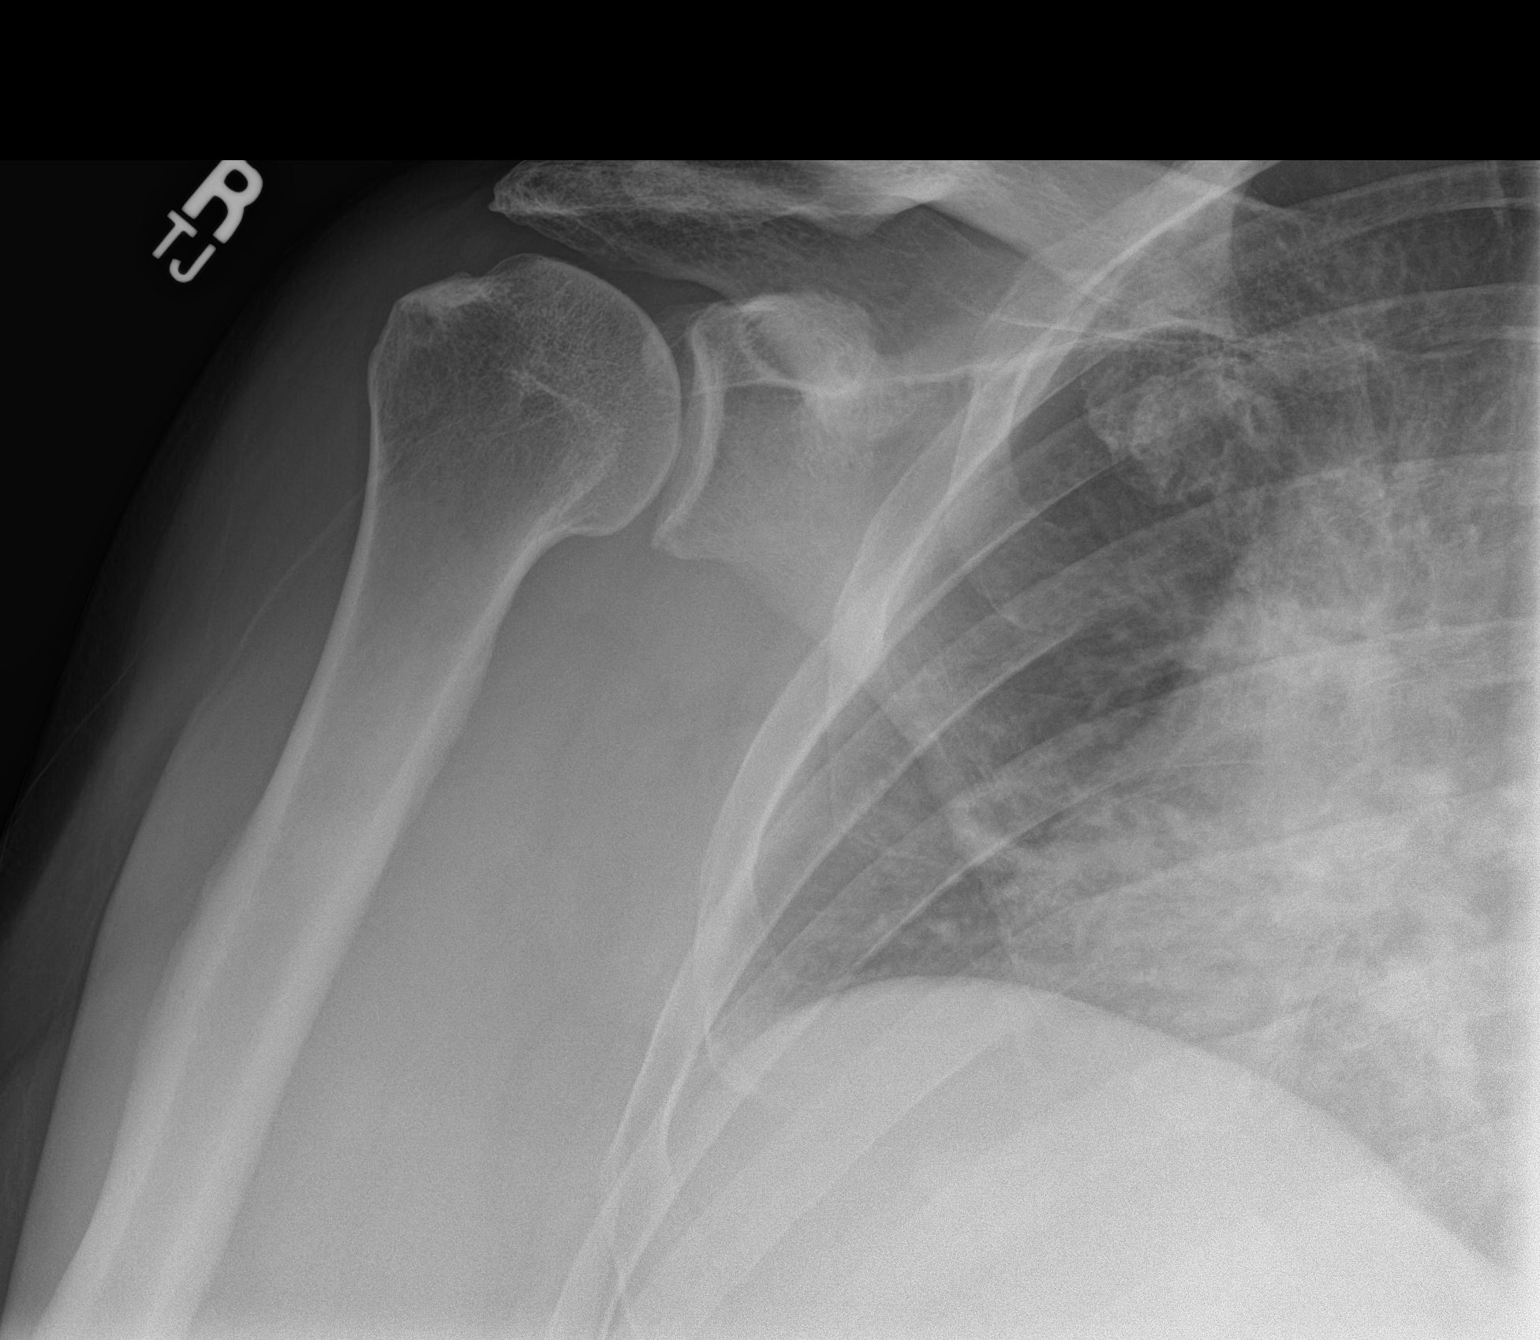

[shoulder y-view]
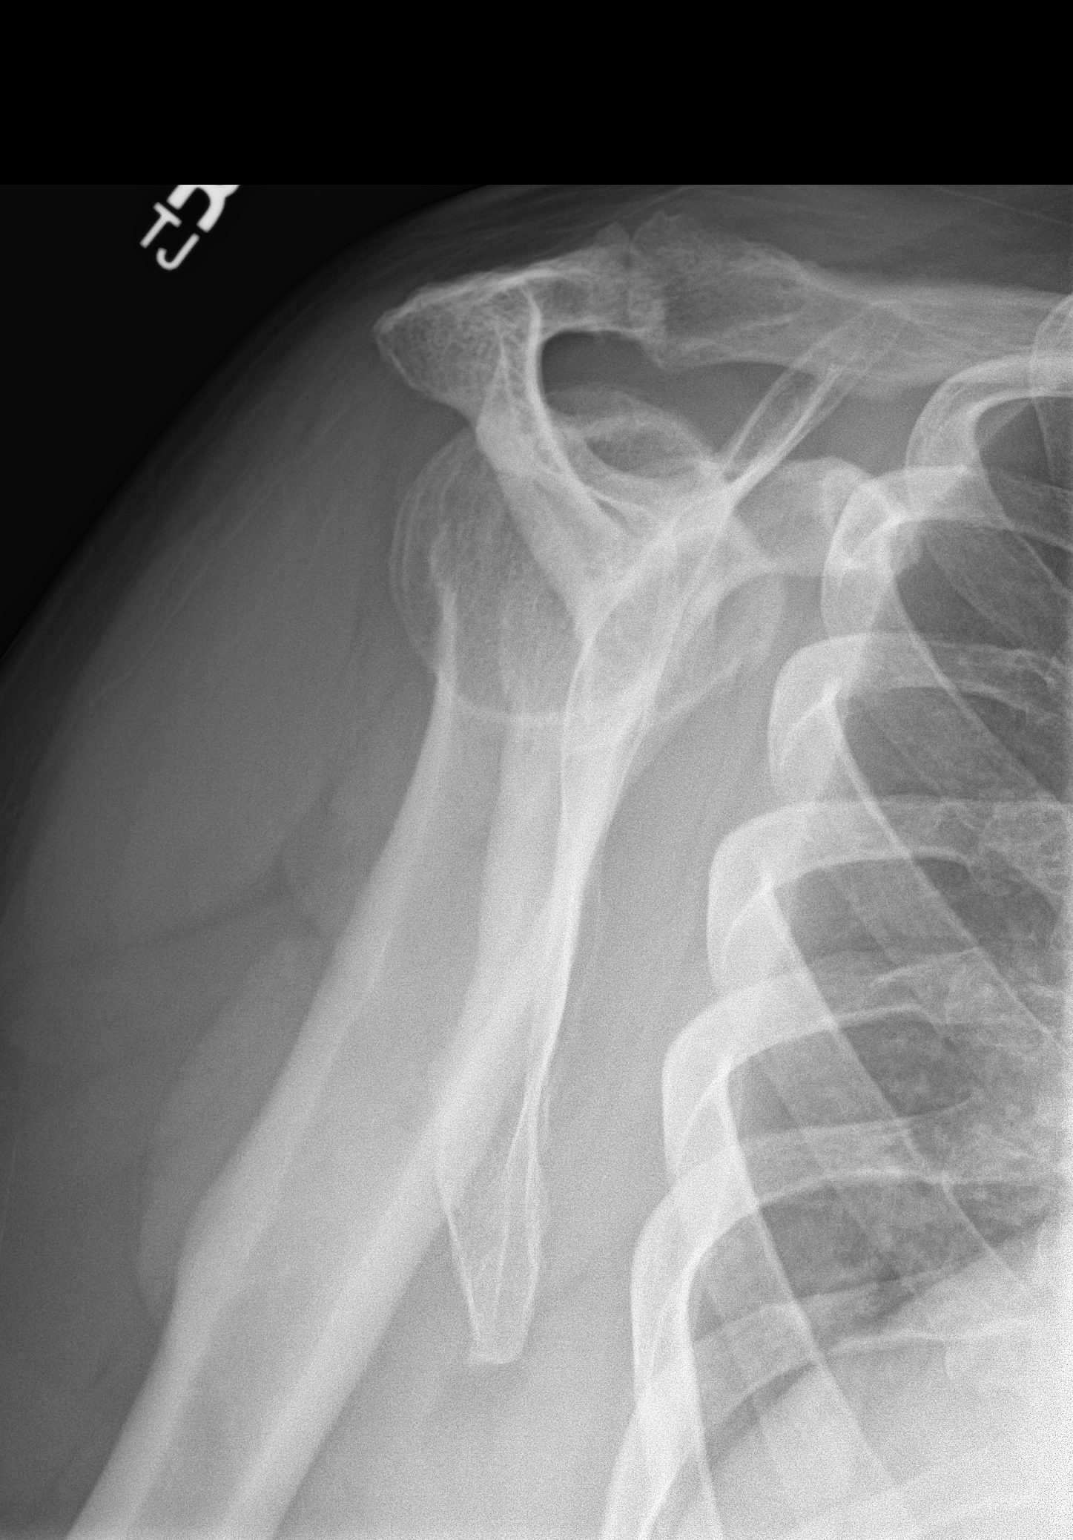

[3 of 3 positions shown; findings below may reference images not displayed]

FINDINGS: There is no evidence of fracture or dislocation. There is no
evidence of arthropathy or other focal bone abnormality. Soft
tissues are unremarkable.
IMPRESSION: Negative.

## 2020-05-21 ENCOUNTER — Encounter: Payer: Self-pay | Admitting: Family Medicine

## 2020-05-23 ENCOUNTER — Encounter: Payer: Self-pay | Admitting: Family Medicine

## 2020-05-23 ENCOUNTER — Telehealth (INDEPENDENT_AMBULATORY_CARE_PROVIDER_SITE_OTHER): Payer: 59 | Admitting: Family Medicine

## 2020-05-23 ENCOUNTER — Telehealth: Payer: Self-pay | Admitting: Family Medicine

## 2020-05-23 DIAGNOSIS — U071 COVID-19: Secondary | ICD-10-CM | POA: Diagnosis not present

## 2020-05-23 NOTE — Assessment & Plan Note (Signed)
Day 5 of symptoms in immunized pt  Fairly mild and starting to improve (congestion/cough) Adv fluids/rest/decongestant prn  Enc to add nasal saline spray and breathe steam Rev ER parameters  inst to call if any worse or if s/s of sinus infection  Will watch for chest tightness or breathing changes

## 2020-05-23 NOTE — Telephone Encounter (Signed)
See phone note

## 2020-05-23 NOTE — Telephone Encounter (Signed)
See my chart message.  Please triage patient.  Thanks. 

## 2020-05-23 NOTE — Telephone Encounter (Addendum)
I spoke with Tyrone Wiley;home covid test positive on 05/21/20.Tyrone Wiley has head congestion and changed decongestant and that helps somewhat; sinus drainage, scratchy S/T, chills only on 05/21/20; some sneezing and prod cough with brownish red phlegm that has changed to clear phlegm. Some SOB on and off. No fever or wheezing. Tyrone Wiley said symptoms seem mild but still Tyrone Wiley feels miserable. Tyrone Wiley has video visit with Dr Milinda Antis 05/23/20 at 12 noon. UC & ED precautions given and Tyrone Wiley voiced understanding.sending note to DR Para March as PCP and Dr Milinda Antis.

## 2020-05-23 NOTE — Progress Notes (Signed)
Virtual Visit via Video Note  I connected with Tyrone Wiley on 05/23/20 at 12:00 PM EST by a video enabled telemedicine application and verified that I am speaking with the correct person using two identifiers.  Location: Patient: home Provider: office   I discussed the limitations of evaluation and management by telemedicine and the availability of in person appointments. The patient expressed understanding and agreed to proceed.  Parties involved in encounter  Patient: Tyrone Wiley   Provider:  Roxy Manns MD   History of Present Illness: Pt presents to discuss covid 24 symptoms   60 yo pt of Dr Para March   Started symptoms Tuesday  Worse Wednesday  Pos home test   Bad nasal congestion -now some sinus pressure over teeth  Nasal drainage- brown on wed/ clear since  ST- improved/ very mild (worse in am)  Chills /sweats-better now  Temp did not go up -stayed around 97   Cough- pretty mild (from pnd)  Some sneezing -not a lot  No wheeze or sob    No loss of taste or smell  Takes stelara   otc Decongestant with tylenol , expectorant (sudafed)  Nasal spray- vics  Has not used saline    Immunized for covid with booster    Patient Active Problem List   Diagnosis Date Noted  . ED (erectile dysfunction) 11/07/2019  . Gallstones   . HLD (hyperlipidemia) 10/24/2016  . Weight gain 10/15/2015  . Advance care planning 10/15/2015  . Psoriasis 09/26/2013  . Palpitations 09/26/2013  . Routine general medical examination at a health care facility 02/05/2011  . Back pain 11/16/2010  . ALLERGIC RHINITIS 01/14/2010   Past Medical History:  Diagnosis Date  . Allergy   . Back pain    with prev L4/L5 bulge  . Gallstones   . History of chicken pox   . Mononucleosis 1981   Hospitalized  . Psoriasis    with joint pain   Past Surgical History:  Procedure Laterality Date  . biopsy for fertility    . TONSILLECTOMY AND ADENOIDECTOMY  1967   Social History   Tobacco  Use  . Smoking status: Never Smoker  . Smokeless tobacco: Never Used  Substance Use Topics  . Alcohol use: Yes    Alcohol/week: 3.0 standard drinks    Types: 3 Standard drinks or equivalent per week    Comment: 3 to 4 drinks per week  . Drug use: No   Family History  Problem Relation Age of Onset  . Hypertension Mother   . Cancer Mother        breast cancer  . Colon polyps Mother   . Arthritis Father   . Cancer Father        throat cancer  . Hyperlipidemia Father   . Seizures Father        h/o seizure-single event in distant past  . Dementia Father   . Alcohol abuse Other   . Arthritis Other   . Diabetes Other        Family History   . Hyperlipidemia Other        Parents, GP's  . Sudden death Other        GP's  . Melanoma Sister   . Heart disease Maternal Grandmother        MI  . Heart disease Maternal Grandfather        MI  . Hypertension Paternal Grandmother   . Hypertension Paternal Grandfather   . Prostate cancer Neg Hx   .  Esophageal cancer Neg Hx   . Stomach cancer Neg Hx    Allergies  Allergen Reactions  . Flexeril [Cyclobenzaprine Hcl]     Lack of effect, mood changes  . Lipitor [Atorvastatin Calcium]     aches  . Mometasone Furoate     REACTION: appetite changes   Current Outpatient Medications on File Prior to Visit  Medication Sig Dispense Refill  . AMINO ACIDS COMPLEX PO Take by mouth.    Marland Kitchen aspirin 81 MG tablet Take 81 mg by mouth daily.    Marland Kitchen glucosamine-chondroitin 500-400 MG tablet Take 1 tablet by mouth daily.    Marland Kitchen loratadine (CLARITIN) 10 MG tablet Take 10 mg by mouth daily as needed for allergies.    . NON FORMULARY CHINESE HERBS    . sildenafil (REVATIO) 20 MG tablet Take 1-5 tablets (20-100 mg total) by mouth daily as needed. 50 tablet 12  . Turmeric 450 MG CAPS Take by mouth daily.    Marland Kitchen Ustekinumab (STELARA [place]) Inject into the skin.     No current facility-administered medications on file prior to visit.   Review of Systems   Constitutional: Positive for chills. Negative for fever and malaise/fatigue.  HENT: Positive for congestion and sore throat. Negative for ear pain and sinus pain.   Eyes: Negative for blurred vision, discharge and redness.  Respiratory: Positive for cough. Negative for sputum production, shortness of breath, wheezing and stridor.   Cardiovascular: Negative for chest pain, palpitations and leg swelling.  Gastrointestinal: Negative for abdominal pain, diarrhea, nausea and vomiting.  Musculoskeletal: Negative for myalgias.  Skin: Negative for rash.  Neurological: Negative for dizziness and headaches.    Observations/Objective: Patient appears well, in no distress Weight is baseline  No facial swelling or asymmetry Pt sounds congested (nasal)  No obvious tremor or mobility impairment Moving neck and UEs normally Able to hear the call well  No cough or shortness of breath during interview  Talkative and mentally sharp with no cognitive changes No skin changes on face or neck , no rash or pallor Affect is normal    Assessment and Plan: Problem List Items Addressed This Visit      Other   COVID-19    Day 5 of symptoms in immunized pt  Fairly mild and starting to improve (congestion/cough) Adv fluids/rest/decongestant prn  Enc to add nasal saline spray and breathe steam Rev ER parameters  inst to call if any worse or if s/s of sinus infection  Will watch for chest tightness or breathing changes           Follow Up Instructions: Drink fluids and rest  Nasal saline for congestion as needed  Tylenol for fever or pain or headache  Please alert [place] if symptoms worsen (if severe or short of breath please go to the ER)     I discussed the assessment and treatment plan with the patient. The patient was provided an opportunity to ask questions and all were answered. The patient agreed with the plan and demonstrated an understanding of the instructions.   The patient was advised to  call back or seek an in-person evaluation if the symptoms worsen or if the condition fails to improve as anticipated.    Roxy Manns, MD

## 2020-05-23 NOTE — Patient Instructions (Signed)
Drink fluids and rest  Nasal saline for congestion as needed  Tylenol for fever or pain or headache  Please alert [place] if symptoms worsen (if severe or short of breath please go to the ER)

## 2020-05-25 NOTE — Telephone Encounter (Signed)
Noted. Thanks.

## 2020-06-20 ENCOUNTER — Encounter: Payer: Self-pay | Admitting: Family Medicine

## 2020-10-26 ENCOUNTER — Other Ambulatory Visit: Payer: Self-pay | Admitting: Family Medicine

## 2020-10-26 DIAGNOSIS — E786 Lipoprotein deficiency: Secondary | ICD-10-CM

## 2020-10-26 DIAGNOSIS — Z125 Encounter for screening for malignant neoplasm of prostate: Secondary | ICD-10-CM

## 2020-10-30 ENCOUNTER — Other Ambulatory Visit (INDEPENDENT_AMBULATORY_CARE_PROVIDER_SITE_OTHER): Payer: 59

## 2020-10-30 ENCOUNTER — Other Ambulatory Visit: Payer: Self-pay

## 2020-10-30 DIAGNOSIS — E786 Lipoprotein deficiency: Secondary | ICD-10-CM

## 2020-10-30 DIAGNOSIS — Z125 Encounter for screening for malignant neoplasm of prostate: Secondary | ICD-10-CM

## 2020-10-30 LAB — COMPREHENSIVE METABOLIC PANEL
ALT: 12 U/L (ref 0–53)
AST: 14 U/L (ref 0–37)
Albumin: 4.2 g/dL (ref 3.5–5.2)
Alkaline Phosphatase: 40 U/L (ref 39–117)
BUN: 20 mg/dL (ref 6–23)
CO2: 27 mEq/L (ref 19–32)
Calcium: 8.9 mg/dL (ref 8.4–10.5)
Chloride: 103 mEq/L (ref 96–112)
Creatinine, Ser: 1.13 mg/dL (ref 0.40–1.50)
GFR: 70.71 mL/min (ref >60.00)
Glucose, Bld: 104 mg/dL — ABNORMAL HIGH (ref 70–99)
Potassium: 5.1 mEq/L (ref 3.5–5.1)
Sodium: 137 mEq/L (ref 135–145)
Total Bilirubin: 1.1 mg/dL (ref 0.2–1.2)
Total Protein: 6.7 g/dL (ref 6.0–8.3)

## 2020-10-30 LAB — LIPID PANEL
Cholesterol: 195 mg/dL (ref 0–200)
HDL: 40 mg/dL (ref >39.00)
LDL Cholesterol: 133 mg/dL — ABNORMAL HIGH (ref 0–99)
NonHDL: 154.87
Total CHOL/HDL Ratio: 5
Triglycerides: 108 mg/dL (ref 0.0–149.0)
VLDL: 21.6 mg/dL (ref 0.0–40.0)

## 2020-10-30 LAB — PSA: PSA: 0.68 ng/mL (ref 0.10–4.00)

## 2020-11-06 ENCOUNTER — Ambulatory Visit (INDEPENDENT_AMBULATORY_CARE_PROVIDER_SITE_OTHER): Payer: 59 | Admitting: Family Medicine

## 2020-11-06 ENCOUNTER — Other Ambulatory Visit: Payer: Self-pay

## 2020-11-06 ENCOUNTER — Encounter: Payer: Self-pay | Admitting: Family Medicine

## 2020-11-06 VITALS — BP 118/76 | HR 95 | Temp 97.9°F | Ht 64.0 in | Wt 224.0 lb

## 2020-11-06 DIAGNOSIS — R011 Cardiac murmur, unspecified: Secondary | ICD-10-CM

## 2020-11-06 DIAGNOSIS — Z0000 Encounter for general adult medical examination without abnormal findings: Secondary | ICD-10-CM | POA: Diagnosis not present

## 2020-11-06 DIAGNOSIS — Z7189 Other specified counseling: Secondary | ICD-10-CM

## 2020-11-06 DIAGNOSIS — L409 Psoriasis, unspecified: Secondary | ICD-10-CM

## 2020-11-06 MED ORDER — MELOXICAM 15 MG PO TABS: 7.5000 mg | ORAL_TABLET | Freq: Every evening | ORAL | 1 refills | Status: DC | PRN

## 2020-11-06 NOTE — Patient Instructions (Addendum)
Check with your insurance to see if they will cover the shingles shot. You should get a call from GI at the end of the year.  If not then let me know if you can't get through to them at [phone].  Take care.  Glad to see you.  Try 1-2 aleve at night with food.  If that isn't helping, then meloxicam may be useful to try. Take it with food.  Don't take them together.    We'll call about the echo appointment.  We'll go from there.

## 2020-11-06 NOTE — Progress Notes (Signed)
This visit occurred during the SARS-CoV-2 public health emergency.  Safety protocols were in place, including screening questions prior to the visit, additional usage of staff PPE, and extensive cleaning of exam room while observing appropriate contact time as indicated for disinfecting solutions.  CPE- See plan.  Routine anticipatory guidance given to patient.  See health maintenance.  The possibility exists that previously documented standard health maintenance information may have been brought forward from a previous encounter into this note.  If needed, that same information has been updated to reflect the current situation based on today's encounter.    Tetanus 2019   Flu shot done prev PNA and shingles d/w pt.   covid vaccine 2021 Colonoscopy 2012.  FH colon polyps but not cancer, mother dx'd at 56.   Living will d/w pt. Would have wife designated if patient were incapacitated.   PSA wnl 2022 Diet and exercise d/w pt.  Doing tai chi and yoga. Labs d/w pt.   HIV and HCV done per derm prev   Psoriasis/psoriatic arthritis, using ibuprofen.  D/w pt about aleve trial. R shoulder pain.  Still exercising and using a treadmill.  Cr wnl, d/w pt.    Incidental murmur.  Good exercise tolerance on treadmill today.  No CP. Not SOB.  No BLE edema.    PMH and SH reviewed  Meds, vitals, and allergies reviewed.   ROS: Per HPI.  Unless specifically indicated otherwise in HPI, the patient denies:  General: fever. Eyes: acute vision changes ENT: sore throat Cardiovascular: chest pain Respiratory: SOB GI: vomiting GU: dysuria Musculoskeletal: acute back pain Derm: acute rash Neuro: acute motor dysfunction Psych: worsening mood Endocrine: polydipsia Heme: bleeding Allergy: hayfever  GEN: nad, alert and oriented HEENT: ncat NECK: supple w/o LA CV: rrr.  Soft systolic murmur noted over the aortic area. PULM: ctab, no inc wob ABD: soft, +bs EXT: no edema SKIN: no acute rash

## 2020-11-09 DIAGNOSIS — R011 Cardiac murmur, unspecified: Secondary | ICD-10-CM | POA: Insufficient documentation

## 2020-11-09 NOTE — Assessment & Plan Note (Signed)
With joint pain noted.  Discussed trial of Aleve with routine cautions regarding NSAIDs.  Creatinine normal.

## 2020-11-09 NOTE — Assessment & Plan Note (Signed)
Living will d/w pt. Would have wife designated if patient were incapacitated.  

## 2020-11-09 NOTE — Assessment & Plan Note (Signed)
  Tetanus 2019   Flu shot done prev PNA and shingles d/w pt.   covid vaccine 2021 Colonoscopy 2012.  FH colon polyps but not cancer, mother dx'd at 59.   Living will d/w pt. Would have wife designated if patient were incapacitated.   PSA wnl 2022 Diet and exercise d/w pt.  Doing tai chi and yoga. Labs d/w pt.   HIV and HCV done per derm prev

## 2020-11-09 NOTE — Assessment & Plan Note (Signed)
Incidental finding.  No symptoms.  Would check echo.  See order.  Discussed.

## 2020-11-24 ENCOUNTER — Encounter: Payer: Self-pay | Admitting: Family Medicine

## 2020-11-26 NOTE — Telephone Encounter (Signed)
When can he be scheduled for his echo?  Thanks.

## 2020-12-17 ENCOUNTER — Other Ambulatory Visit (HOSPITAL_COMMUNITY): Payer: 59

## 2020-12-18 ENCOUNTER — Ambulatory Visit (HOSPITAL_COMMUNITY): Payer: 59 | Attending: Cardiology

## 2020-12-18 ENCOUNTER — Other Ambulatory Visit: Payer: Self-pay

## 2020-12-18 DIAGNOSIS — R011 Cardiac murmur, unspecified: Secondary | ICD-10-CM | POA: Insufficient documentation

## 2020-12-18 LAB — ECHOCARDIOGRAM COMPLETE
AR max vel: 1.4 cm2
AV Area VTI: 1.54 cm2
AV Area mean vel: 1.39 cm2
AV Mean grad: 18 mmHg
AV Peak grad: 32.9 mmHg
Ao pk vel: 2.87 m/s
Area-P 1/2: 3.65 cm2
P 1/2 time: 309 msec
S' Lateral: 2.8 cm

## 2020-12-18 MED ORDER — PERFLUTREN LIPID MICROSPHERE
1.0000 mL | INTRAVENOUS | Status: AC | PRN
  Administered 2020-12-18: 1 mL via INTRAVENOUS

## 2020-12-19 ENCOUNTER — Encounter: Payer: Self-pay | Admitting: Family Medicine

## 2021-01-01 ENCOUNTER — Other Ambulatory Visit: Payer: Self-pay | Admitting: Family Medicine

## 2021-01-01 NOTE — Telephone Encounter (Signed)
Refill request for meloxicam (MOBIC) 15 MG tablet  LOV - 11/06/20 Next OV - not scheduled Last refill - 11/06/20 #30/1

## 2021-01-02 NOTE — Telephone Encounter (Signed)
Sent. Thanks.   

## 2021-02-03 ENCOUNTER — Encounter: Payer: Self-pay | Admitting: Family Medicine

## 2021-04-20 ENCOUNTER — Encounter: Payer: Self-pay | Admitting: Family Medicine

## 2021-05-08 ENCOUNTER — Ambulatory Visit (INDEPENDENT_AMBULATORY_CARE_PROVIDER_SITE_OTHER): Payer: 59 | Admitting: Family Medicine

## 2021-05-08 ENCOUNTER — Encounter: Payer: Self-pay | Admitting: Family Medicine

## 2021-05-08 ENCOUNTER — Other Ambulatory Visit: Payer: Self-pay

## 2021-05-08 DIAGNOSIS — H109 Unspecified conjunctivitis: Secondary | ICD-10-CM

## 2021-05-08 MED ORDER — POLYMYXIN B-TRIMETHOPRIM 10000-0.1 UNIT/ML-% OP SOLN: 1.0000 [drp] | Freq: Four times a day (QID) | OPHTHALMIC | 0 refills | Status: DC

## 2021-05-08 NOTE — Patient Instructions (Signed)
Start polytrim and update me as needed.  Take care.  Glad to see you.

## 2021-05-08 NOTE — Progress Notes (Signed)
This visit occurred during the SARS-CoV-2 public health emergency.  Safety protocols were in place, including screening questions prior to the visit, additional usage of staff PPE, and extensive cleaning of exam room while observing appropriate contact time as indicated for disinfecting solutions.  R eye w/o known FB or trauma.  Sx started yesterday.  No matting this AM.  He feels like he is looking through a film in the R eye.  Normal L eye vision.  No field cuts or deficits.  No FCNAVD.  No L eye sx.  No facial rash.  No known exposure to pinkeye.  Right now R eye is less red than earlier this AM.    Needs refill on sildenafil.  Sent.  Meds, vitals, and allergies reviewed.   ROS: Per HPI unless specifically indicated in ROS section   Nad Ncat PERRL EOMI, right conjunctival injection noted.  No foreign body seen.  Left conjunctiva normal-appearing.  No foreign body seen. Limited nondilated funduscopic exam without retinal abnormality noted. Staining done in exam under [place] lamp shows no corneal abnormality.  He felt better when tetracaine was applied. Skin without rash.

## 2021-05-10 DIAGNOSIS — H109 Unspecified conjunctivitis: Secondary | ICD-10-CM | POA: Insufficient documentation

## 2021-05-10 MED ORDER — SILDENAFIL CITRATE 20 MG PO TABS: 20.0000 mg | ORAL_TABLET | Freq: Every day | ORAL | 12 refills | Status: DC | PRN

## 2021-05-10 NOTE — Assessment & Plan Note (Signed)
No foreign body or abrasion seen.  Okay for outpatient follow-up.  Discussed options.  We will start using Polytrim in the right as directed.  If he has any symptoms in the left eye then he can use it there.  He will update me as needed.  He agrees with plan.

## 2021-05-11 ENCOUNTER — Encounter: Payer: Self-pay | Admitting: Family Medicine

## 2021-06-16 ENCOUNTER — Telehealth: Payer: Self-pay | Admitting: Family Medicine

## 2021-06-16 ENCOUNTER — Encounter: Payer: Self-pay | Admitting: Family Medicine

## 2021-06-16 NOTE — Telephone Encounter (Signed)
Spoke with patient about sx. Patient will continue to do drops x 3-4 days and will stop if gets better. Patient will call back for recheck if does not get better.

## 2021-06-16 NOTE — Telephone Encounter (Signed)
I think if any progressive sx, then he needs to get checked.  If he has worsening vision, then he needs recheck.  If mild sx o/w, I would try the drop for 3-4 days and then stop if better.  Thanks.

## 2021-06-16 NOTE — Telephone Encounter (Signed)
Please see mychart message about pinkeye and please triage patient about recent sx.  I asked him to preemptively restart his drops in the meantime.  Thanks.

## 2021-06-16 NOTE — Telephone Encounter (Signed)
I spoke with pt;pt last seen for conjunctivitis on 05/08/21. Pt said all symptoms cleared before stopping eye drops last time. Pt said last night noticed redness and slight clear drainage from rt eye;no pain and no itching. To pts knowledge no known exposure to anyone with pink eye. Pt is not aware of any injury or foreign body to eye. Pt wears safety glasses at work. Today pt said early morning did have some clear drainage from rt eye but that is better as day has gone on. Pt does not have a fever and no rash noted. Pt does still have some redness to rt eye and the vision in rt is cloudy; pt said less cloudy vision than noticed earlier.pt has restarted polytrim eye drops as instructed by Dr Para March and pt has about 1/4 bottle left of eye drops from January usage. [facility]. Pt request cb after Dr Para March reviews this note. Sending note to Dr Para March and Shanda Bumps CMA.

## 2021-07-22 ENCOUNTER — Encounter: Payer: Self-pay | Admitting: Family Medicine

## 2021-07-22 NOTE — Telephone Encounter (Signed)
Called patient. States has been getting elevated readings at both home and appointments for last few months.He has been checking at home. Denies any chest pain, palpitations,  shortness of breath, headache, swelling or legs or feet or headache. I have scheduled him for a follow up in office with you next week. If develops any symptoms or blood pressure gets elevated and does not lower he will be seen at ED/urgent care. I have reviewed most accurate way to take blood pressure at home as well as all red words with patient. He will bring list of readings to office for apointment to review. With Dr. Para March.  ?

## 2021-07-23 LAB — CBC AND DIFFERENTIAL
Hemoglobin: 14.7 (ref 13.5–17.5)
Platelets: 215 10*3/uL (ref 150–400)

## 2021-07-23 LAB — LIPID PANEL
Cholesterol: 222 — AB (ref 0–200)
HDL: 46 (ref 35–70)
LDL Cholesterol: 128
Triglycerides: 269 — AB (ref 40–160)

## 2021-07-23 LAB — HEMOGLOBIN A1C: Hemoglobin A1C: 5.2

## 2021-07-23 LAB — BASIC METABOLIC PANEL
Creatinine: 1 (ref <=1.3)
Glucose: 95
Potassium: 4.2 mEq/L (ref 3.5–5.1)

## 2021-07-23 LAB — HEPATIC FUNCTION PANEL
ALT: 12 U/L (ref 10–40)
AST: 17 (ref 14–40)

## 2021-07-23 LAB — PSA: PSA: 0.7

## 2021-07-28 ENCOUNTER — Encounter: Payer: Self-pay | Admitting: Family Medicine

## 2021-07-28 ENCOUNTER — Ambulatory Visit (INDEPENDENT_AMBULATORY_CARE_PROVIDER_SITE_OTHER): Payer: 59 | Admitting: Family Medicine

## 2021-07-28 VITALS — BP 142/72 | HR 81 | Temp 98.0°F | Ht 64.0 in | Wt 226.0 lb

## 2021-07-28 DIAGNOSIS — Z1211 Encounter for screening for malignant neoplasm of colon: Secondary | ICD-10-CM

## 2021-07-28 DIAGNOSIS — I10 Essential (primary) hypertension: Secondary | ICD-10-CM

## 2021-07-28 MED ORDER — AMLODIPINE BESYLATE 2.5 MG PO TABS: 2.5000 mg | ORAL_TABLET | Freq: Every day | ORAL | 3 refills | Status: DC

## 2021-07-28 MED ORDER — NAPROXEN SODIUM 220 MG PO CAPS: ORAL_CAPSULE | ORAL | Status: DC

## 2021-07-28 NOTE — Patient Instructions (Signed)
Try taking up to 2 extra strength tylenol at night instead of aleve.   ? ?If BP isn't better (<140/<90), then try taking amlopdine 2.5mg  a day.   ? ?Update me if your BP is still above 140/90 after about 2 weeks or as needed.   ? ?Take care.  Glad to see you. ?

## 2021-07-28 NOTE — Progress Notes (Signed)
Eye sx are better from prior.   ? ?Recently with elevated BP readings.  He thought it was related to back pain initially.  His back is better in the meantime. He has still had elevated BP readings in the meantime.   ? ?BP was elevated at the time of blood draw- results d/w pt.  He had nonfasting labs and that likely affected his lipids.  Stressors d/w pt.  BP has been usually ~150 systolic at home.  No CP, SOB, BLE edema.  He doesn't feel unwell.   ? ?He is already back in the gym.  ? ?Discussed with patient about GI follow-up and I will send a note to the GI clinic about getting him scheduled. ? ?Meds, vitals, and allergies reviewed.  ? ?ROS: Per HPI unless specifically indicated in ROS section  ? ?GEN: nad, alert and oriented ?HEENT: ncat ?NECK: supple w/o LA ?CV: rrr. ?PULM: ctab, no inc wob ?ABD: soft, +bs ?EXT: no edema ?SKIN: Well-perfused. ? ? ?

## 2021-07-29 DIAGNOSIS — I10 Essential (primary) hypertension: Secondary | ICD-10-CM | POA: Insufficient documentation

## 2021-07-29 NOTE — Assessment & Plan Note (Signed)
Discussed options. ?Stop aleve, try tylenol, then use amlodipine if blood pressure is still elevated.  We can recheck lipids later on.  Routine cautions given on amlodipine.  He can update me as needed.  See after visit summary. ?

## 2021-07-30 ENCOUNTER — Encounter: Payer: Self-pay | Admitting: Gastroenterology

## 2021-08-27 ENCOUNTER — Ambulatory Visit (AMBULATORY_SURGERY_CENTER): Payer: 59 | Admitting: *Deleted

## 2021-08-27 VITALS — Ht 64.0 in | Wt 220.0 lb

## 2021-08-27 DIAGNOSIS — Z1211 Encounter for screening for malignant neoplasm of colon: Secondary | ICD-10-CM

## 2021-08-27 MED ORDER — NA SULFATE-K SULFATE-MG SULF 17.5-3.13-1.6 GM/177ML PO SOLN: 2.0000 | Freq: Once | ORAL | 0 refills | Status: AC

## 2021-08-27 NOTE — Progress Notes (Signed)
No egg or soy allergy known to patient  No issues known to pt with past sedation with any surgeries or procedures Patient denies ever being told they had issues or difficulty with intubation  No FH of Malignant Hyperthermia Pt is not on diet pills Pt is not on  home 02  Pt is not on blood thinners  Pt denies issues with constipation  No A fib or A flutter   PV completed over the phone. Pt verified name, DOB, address and insurance during PV today.    Pt encouraged to call with questions or issues.  If pt has My chart, procedure instructions sent via My Chart  Insurance confirmed with pt at PV today   

## 2021-09-11 ENCOUNTER — Encounter: Payer: Self-pay | Admitting: Gastroenterology

## 2021-09-17 ENCOUNTER — Ambulatory Visit (AMBULATORY_SURGERY_CENTER): Payer: 59 | Admitting: Gastroenterology

## 2021-09-17 ENCOUNTER — Encounter: Payer: Self-pay | Admitting: Gastroenterology

## 2021-09-17 VITALS — BP 122/70 | HR 86 | Temp 98.9°F | Resp 30 | Ht 64.0 in | Wt 220.0 lb

## 2021-09-17 DIAGNOSIS — Z1211 Encounter for screening for malignant neoplasm of colon: Secondary | ICD-10-CM

## 2021-09-17 MED ORDER — SODIUM CHLORIDE 0.9 % IV SOLN: 500.0000 mL | Freq: Once | INTRAVENOUS | Status: DC

## 2021-09-17 NOTE — Progress Notes (Signed)
History and Physical:  This patient presents for endoscopic testing for: Encounter Diagnosis  Name Primary?   Special screening for malignant neoplasms, colon Yes    No polyps on last screening colonoscopy by Dr. Arlyce Dice November 2012. Patient denies chronic abdominal pain, rectal bleeding, constipation or diarrhea.   Patient is otherwise without complaints or active issues today.   Past Medical History: Past Medical History:  Diagnosis Date   Allergy    Back pain    with prev L4/L5 bulge   Heart murmur    History of chicken pox    Hyperlipidemia    Hypertension    Mononucleosis 1981   Hospitalized   Psoriasis    with joint pain   Renal stone      Past Surgical History: Past Surgical History:  Procedure Laterality Date   biopsy for fertility     TONSILLECTOMY AND ADENOIDECTOMY  1967    Allergies: Allergies  Allergen Reactions   Flexeril [Cyclobenzaprine Hcl]     Lack of effect, mood changes   Lipitor [Atorvastatin Calcium]     aches   Mometasone Furoate     REACTION: appetite changes    Outpatient Meds: Current Outpatient Medications  Medication Sig Dispense Refill   fluticasone (FLONASE) 50 MCG/ACT nasal spray Place into both nostrils 3 (three) times a week.     amLODipine (NORVASC) 2.5 MG tablet Take 1 tablet (2.5 mg total) by mouth daily. (Patient not taking: Reported on 08/27/2021) 90 tablet 3   aspirin 81 MG tablet Take 81 mg by mouth daily. (Patient not taking: Reported on 08/27/2021)     Naproxen Sodium 220 MG CAPS 1-2 tabs taken with food, used most days.  Reasonable to try to substitute 2 extra strength tylenol at night. (Patient not taking: Reported on 08/27/2021)     sildenafil (REVATIO) 20 MG tablet Take 1-5 tablets (20-100 mg total) by mouth daily as needed. 50 tablet 12   STELARA 90 MG/ML SOSY injection Inject into the skin.     Current Facility-Administered Medications  Medication Dose Route Frequency Provider Last Rate Last Admin   0.9 %   sodium chloride infusion  500 mL Intravenous Once Sherrilyn Rist, MD          ___________________________________________________________________ Objective   Exam:  BP 140/67   Pulse 76   Temp 98.9 F (37.2 C)   Ht 5\' 4"  (1.626 m)   Wt 220 lb (99.8 kg)   SpO2 96%   BMI 37.76 kg/m   CV: RRR without murmur, S1/S2 Resp: clear to auscultation bilaterally, normal RR and effort noted GI: soft, no tenderness, with active bowel sounds.   Assessment: Encounter Diagnosis  Name Primary?   Special screening for malignant neoplasms, colon Yes     Plan: Colonoscopy  The benefits and risks of the planned procedure were described in detail with the patient or (when appropriate) their health care proxy.  Risks were outlined as including, but not limited to, bleeding, infection, perforation, adverse medication reaction leading to cardiac or pulmonary decompensation, pancreatitis (if ERCP).  The limitation of incomplete mucosal visualization was also discussed.  No guarantees or warranties were given.    The patient is appropriate for an endoscopic procedure in the ambulatory setting.   - , MD

## 2021-09-17 NOTE — Patient Instructions (Signed)
? ? ?  Handouts on diverticulosis & hemorrhoids given to you today ? ? ? ?YOU HAD AN ENDOSCOPIC PROCEDURE TODAY AT THE [facility]:   Refer to the procedure report that was given to you for any specific questions about what was found during the examination.  If the procedure report does not answer your questions, please call your gastroenterologist to clarify.  If you requested that your care partner not be given the details of your procedure findings, then the procedure report has been included in a sealed envelope for you to review at your convenience later. ? ?YOU SHOULD EXPECT: Some feelings of bloating in the abdomen. Passage of more gas than usual.  Walking can help get rid of the air that was put into your GI tract during the procedure and reduce the bloating. If you had a lower endoscopy (such as a colonoscopy or flexible sigmoidoscopy) you may notice spotting of blood in your stool or on the toilet paper. If you underwent a bowel prep for your procedure, you may not have a normal bowel movement for a few days. ? ?Please Note:  You might notice some irritation and congestion in your nose or some drainage.  This is from the oxygen used during your procedure.  There is no need for concern and it should clear up in a day or so. ? ?SYMPTOMS TO REPORT IMMEDIATELY: ? ?Following lower endoscopy (colonoscopy or flexible sigmoidoscopy): ? Excessive amounts of blood in the stool ? Significant tenderness or worsening of abdominal pains ? Swelling of the abdomen that is new, acute ? Fever of 100?F or higher ? ? ?For urgent or emergent issues, a gastroenterologist can be reached at any hour by calling [phone]. ?Do not use MyChart messaging for urgent concerns.  ? ? ?DIET:  We do recommend a small meal at first, but then you may proceed to your regular diet.  Drink plenty of fluids but you should avoid alcoholic beverages for 24 hours. ? ?ACTIVITY:  You should plan to take it easy for the rest of today  and you should NOT DRIVE or use heavy machinery until tomorrow (because of the sedation medicines used during the test).   ? ?FOLLOW UP: ?Our staff will call the number listed on your records 48-72 hours following your procedure to check on you and address any questions or concerns that you may have regarding the information given to you following your procedure. If we do not reach you, we will leave a message.  We will attempt to reach you two times.  During this call, we will ask if you have developed any symptoms of COVID 19. If you develop any symptoms (ie: fever, flu-like symptoms, shortness of breath, cough etc.) before then, please call [phone].  If you test positive for Covid 19 in the 2 weeks post procedure, please call and report this information to us.   ? ?If any biopsies were taken you will be contacted by phone or by letter within the next 1-3 weeks.  Please call us at [phone] if you have not heard about the biopsies in 3 weeks.  ? ? ?SIGNATURES/CONFIDENTIALITY: ?You and/or your care partner have signed paperwork which will be entered into your electronic medical record.  These signatures attest to the fact that that the information above on your After Visit Summary has been reviewed and is understood.  Full responsibility of the confidentiality of this discharge information lies with you and/or your care-partner.  ?

## 2021-09-17 NOTE — Progress Notes (Signed)
Pt's states no medical or surgical changes since previsit or office visit. 

## 2021-09-17 NOTE — Op Note (Signed)
[facility] Patient Name: Tyrone KicksDouglas Wiley Procedure Date: 09/17/2021 7:23 AM MRN: [id number] Endoscopist: Sherilyn CooterHenry L. Myrtie Neitheranis , MD Age: [id number] Referring MD:  Date of Birth: January 14, 1961 Gender: Male Account #: [id number] Procedure:                Colonoscopy Indications:              Screening for colorectal malignant neoplasm                           No polyps last colonoscopy Nov 2012 Medicines:                Monitored Anesthesia Care Procedure:                Pre-Anesthesia Assessment:                           - Prior to the procedure, a History and Physical                            was performed, and patient medications and                            allergies were reviewed. The patient's tolerance of                            previous anesthesia was also reviewed. The risks                            and benefits of the procedure and the sedation                            options and risks were discussed with the patient.                            All questions were answered, and informed consent                            was obtained. Prior Anticoagulants: The patient has                            taken no previous anticoagulant or antiplatelet                            agents. ASA Grade Assessment: II - A patient with                            mild systemic disease. After reviewing the risks                            and benefits, the patient was deemed in                            satisfactory condition to undergo the procedure.  After obtaining informed consent, the colonoscope                            was passed under direct vision. Throughout the                            procedure, the patient's blood pressure, pulse, and                            oxygen saturations were monitored continuously. The                            CF [id number] #[id number] was introduced through the anus                            and advanced to the the cecum,  identified by                            appendiceal orifice and ileocecal valve. The                            colonoscopy was performed without difficulty. The                            patient tolerated the procedure well. The quality                            of the bowel preparation was good. The ileocecal                            valve, appendiceal orifice, and rectum were                            photographed. The bowel preparation used was SUPREP. Scope In: 7:58:51 AM Scope Out: 8:12:09 AM Scope Withdrawal Time: 0 hours 10 minutes 15 seconds  Total Procedure Duration: 0 hours 13 minutes 18 seconds  Findings:                 The perianal and digital rectal examinations were                            normal.                           Repeat examination of right colon under NBI                            performed.                           Multiple diverticula were found in the left colon.                           Internal hemorrhoids were found.  The exam was otherwise without abnormality on                            direct and retroflexion views. Complications:            No immediate complications. Estimated Blood Loss:     Estimated blood loss: none. Impression:               - Diverticulosis in the left colon.                           - Internal hemorrhoids.                           - The examination was otherwise normal on direct                            and retroflexion views.                           - No specimens collected. Recommendation:           - Patient has a contact number available for                            emergencies. The signs and symptoms of potential                            delayed complications were discussed with the                            patient. Return to normal activities tomorrow.                            Written discharge instructions were provided to the                            patient.                            - Resume previous diet.                           - Continue present medications.                           - Repeat colonoscopy in 10 years for screening                            purposes. (consume more water with prep for next                            exam) Sherilyn Cooter L. Myrtie Neither, MD 09/17/2021 8:15:54 AM This report has been signed electronically.

## 2021-09-17 NOTE — Progress Notes (Signed)
A and O x3. Report to RN. Tolerated MAC anesthesia well. 

## 2021-09-18 ENCOUNTER — Telehealth: Payer: Self-pay

## 2021-09-18 NOTE — Telephone Encounter (Signed)
  Follow up Call-     09/17/2021    7:26 AM  Call back number  Post procedure Call Back phone  # [phone]  Permission to leave phone message Yes     Patient questions:  Do you have a fever, pain , or abdominal swelling? No. Pain Score  0 *  Have you tolerated food without any problems? Yes.    Have you been able to return to your normal activities? Yes.    Do you have any questions about your discharge instructions: Diet   No. Medications  No. Follow up visit  No.  Do you have questions or concerns about your Care? No.  Actions: * If pain score is 4 or above: No action needed, pain <4.

## 2021-11-01 ENCOUNTER — Other Ambulatory Visit: Payer: Self-pay | Admitting: Family Medicine

## 2021-11-01 DIAGNOSIS — I10 Essential (primary) hypertension: Secondary | ICD-10-CM

## 2021-11-03 ENCOUNTER — Other Ambulatory Visit (INDEPENDENT_AMBULATORY_CARE_PROVIDER_SITE_OTHER): Payer: 59

## 2021-11-03 DIAGNOSIS — I10 Essential (primary) hypertension: Secondary | ICD-10-CM | POA: Diagnosis not present

## 2021-11-03 LAB — LIPID PANEL
Cholesterol: 207 mg/dL — ABNORMAL HIGH (ref 0–200)
HDL: 41.6 mg/dL (ref >39.00)
LDL Cholesterol: 131 mg/dL — ABNORMAL HIGH (ref 0–99)
NonHDL: 165.19
Total CHOL/HDL Ratio: 5
Triglycerides: 173 mg/dL — ABNORMAL HIGH (ref 0.0–149.0)
VLDL: 34.6 mg/dL (ref 0.0–40.0)

## 2021-11-10 ENCOUNTER — Encounter: Payer: Self-pay | Admitting: Family Medicine

## 2021-11-10 ENCOUNTER — Ambulatory Visit (INDEPENDENT_AMBULATORY_CARE_PROVIDER_SITE_OTHER): Payer: 59 | Admitting: Family Medicine

## 2021-11-10 VITALS — BP 130/62 | HR 84 | Temp 97.9°F | Ht 64.0 in | Wt 224.0 lb

## 2021-11-10 DIAGNOSIS — G4700 Insomnia, unspecified: Secondary | ICD-10-CM

## 2021-11-10 DIAGNOSIS — L409 Psoriasis, unspecified: Secondary | ICD-10-CM

## 2021-11-10 DIAGNOSIS — N529 Male erectile dysfunction, unspecified: Secondary | ICD-10-CM

## 2021-11-10 DIAGNOSIS — Z0000 Encounter for general adult medical examination without abnormal findings: Secondary | ICD-10-CM | POA: Diagnosis not present

## 2021-11-10 DIAGNOSIS — Z7189 Other specified counseling: Secondary | ICD-10-CM

## 2021-11-10 NOTE — Progress Notes (Unsigned)
CPE- See plan.  Routine anticipatory guidance given to patient.  See health maintenance.  The possibility exists that previously documented standard health maintenance information may have been brought forward from a previous encounter into this note.  If needed, that same information has been updated to reflect the current situation based on today's encounter.    Tetanus 2019   Flu shot done prev PNA not due.  Shingles prev done.   covid vaccine 2021 Colonoscopy 2023.  FH colon polyps but not cancer, mother dx'd at 47.   Living will d/w pt. Would have wife designated if patient were incapacitated.   PSA wnl 2023 Diet and exercise d/w pt.  Doing tai chi and yoga. Labs d/w pt.   HIV and HCV done per derm prev   Labs discussed with patient.  He didn't have to start amlodipine and BP was controlled today.  D/w pt.    He had trouble with waking in the middle of the night and tried using CBD gummie.  No ADE on med. Work stressors d/w pt.  Exercise and meditation helps.    ED improved with sildenafil w/o ADE on med.     Psoriasis/psoriatic arthritis.  Still on stelara.  Rare outbreak.  Does better in summer.   He irritated his back earlier this week but that is getting some better in the meantime.  PMH and SH reviewed  Meds, vitals, and allergies reviewed.   ROS: Per HPI.  Unless specifically indicated otherwise in HPI, the patient denies:  General: fever. Eyes: acute vision changes ENT: sore throat Cardiovascular: chest pain Respiratory: SOB GI: vomiting GU: dysuria Musculoskeletal: acute back pain Derm: acute rash Neuro: acute motor dysfunction Psych: worsening mood Endocrine: polydipsia Heme: bleeding Allergy: hayfever  GEN: nad, alert and oriented HEENT: ncat NECK: supple w/o LA CV: rrr. PULM: ctab, no inc wob ABD: soft, +bs EXT: no edema SKIN: no acute rash

## 2021-11-10 NOTE — Patient Instructions (Signed)
Update me as needed.  Recheck yearly.  Take care.  Glad to see you.

## 2021-11-11 DIAGNOSIS — G4700 Insomnia, unspecified: Secondary | ICD-10-CM | POA: Insufficient documentation

## 2021-11-11 NOTE — Assessment & Plan Note (Signed)
He had trouble with waking in the middle of the night and tried using CBD gummie.  No ADE on med. Work stressors d/w pt.  Exercise and meditation helps.   Would continue as is.  He will update me as needed.

## 2021-11-11 NOTE — Assessment & Plan Note (Signed)
Tetanus 2019   Flu shot done prev PNA not due.  Shingles prev done.   covid vaccine 2021 Colonoscopy 2023.  FH colon polyps but not cancer, mother dx'd at 54.   Living will d/w pt. Would have wife designated if patient were incapacitated.   PSA wnl 2023 Diet and exercise d/w pt.  Doing tai chi and yoga. Labs d/w pt.   HIV and HCV done per derm prev   He didn't have to start amlodipine and BP was controlled today.  D/w pt.

## 2021-11-11 NOTE — Assessment & Plan Note (Signed)
Living will d/w pt. Would have wife designated if patient were incapacitated.  

## 2021-11-11 NOTE — Assessment & Plan Note (Signed)
Per outside clinic.  I will defer. 

## 2021-11-11 NOTE — Assessment & Plan Note (Signed)
Continue as needed sildenafil. 

## 2022-01-26 ENCOUNTER — Encounter: Payer: Self-pay | Admitting: Family Medicine

## 2022-03-09 ENCOUNTER — Encounter: Payer: Self-pay | Admitting: Family Medicine

## 2022-03-16 ENCOUNTER — Encounter: Payer: Self-pay | Admitting: Family Medicine

## 2022-03-16 ENCOUNTER — Ambulatory Visit (INDEPENDENT_AMBULATORY_CARE_PROVIDER_SITE_OTHER): Payer: 59 | Admitting: Family Medicine

## 2022-03-16 VITALS — BP 138/76 | HR 78 | Temp 98.0°F | Ht 64.0 in | Wt 225.0 lb

## 2022-03-16 DIAGNOSIS — G4700 Insomnia, unspecified: Secondary | ICD-10-CM | POA: Diagnosis not present

## 2022-03-16 MED ORDER — TRAZODONE HCL 50 MG PO TABS: 25.0000 mg | ORAL_TABLET | Freq: Every evening | ORAL | Status: DC | PRN

## 2022-03-16 MED ORDER — TRAZODONE HCL 50 MG PO TABS: 25.0000 mg | ORAL_TABLET | Freq: Every evening | ORAL | 2 refills | Status: DC | PRN

## 2022-03-16 NOTE — Progress Notes (Unsigned)
Sleep d/w pt.  Longterm issue.  Episodically worse then will have a period of time with dec sx.  Tried mult OTC meds, variable effect.  Trouble with continuation of sleep, ie waking at 2:30 in the AM.  Does well with sleep initiation.  He can feel "antsy", unclear if that is causing or resulting from insomnia.  Mood is better after a normal night of sleep. Exercising.  He has cut back on etoh, a few drinks in a week.  He is on a schedule with bedtime and waking.  Nocturia, but he didn't think that was causative.  He has cut back on PM fluid and that didn't fix the issue.  He isn't drinking caffeine prior to bed.  Nocturia ~1-2 per night.  Home situation is good. Work stressors d/w pt.    Meds, vitals, and allergies reviewed.   ROS: Per HPI unless specifically indicated in ROS section   GEN: nad, alert and oriented HEENT: ncat NECK: supple w/o LA CV: rrr.   ABD: soft, +bs EXT: no edema SKIN: no acute rash

## 2022-03-16 NOTE — Patient Instructions (Signed)
Try trazodone as needed.  Initially I would only use it on a night when you don't have work the next day.  Update me as needed.   Take care.  Glad to see you.

## 2022-03-17 NOTE — Assessment & Plan Note (Addendum)
Continue with sleep hygiene, discussed. Try trazodone as needed.  Initially I would only use it on a night when he does not have work the next day.  Update me as needed.   Routine cautions given to patient.  He agrees to plan. If he continues to have nocturia that is contributing to insomnia he can let me know.

## 2022-04-07 ENCOUNTER — Other Ambulatory Visit: Payer: Self-pay | Admitting: Family Medicine

## 2022-04-08 ENCOUNTER — Telehealth: Payer: Self-pay | Admitting: Family Medicine

## 2022-04-08 ENCOUNTER — Encounter: Payer: Self-pay | Admitting: Family Medicine

## 2022-04-08 MED ORDER — BACLOFEN 5 MG PO TABS: 5.0000 mg | ORAL_TABLET | Freq: Three times a day (TID) | ORAL | 1 refills | Status: DC | PRN

## 2022-04-08 NOTE — Addendum Note (Signed)
Addended by: Joaquim Nam on: 04/08/2022 02:14 PM   Modules accepted: Orders

## 2022-04-08 NOTE — Telephone Encounter (Signed)
He didn't tolerate flexeril but could try baclofen with sedation caution.  Rx sent.  Thanks.

## 2022-04-08 NOTE — Telephone Encounter (Signed)
Patient would like to know if Dr Para March can prescribe him a muscle relaxer for his back,his  chiropractor thinks that the muscles in his back needs extra help relaxing ,and she's not able to prescribe medications. He stated that Dr Para March is familiar with his back issues.

## 2022-04-08 NOTE — Telephone Encounter (Signed)
Patient aware rx was sent.

## 2022-04-12 ENCOUNTER — Other Ambulatory Visit: Payer: Self-pay | Admitting: Family Medicine

## 2022-05-25 ENCOUNTER — Encounter: Payer: Self-pay | Admitting: Family Medicine

## 2022-05-31 ENCOUNTER — Telehealth: Payer: Self-pay | Admitting: *Deleted

## 2022-05-31 NOTE — Telephone Encounter (Signed)
Left message for Tyrone Wiley to please call the office to schedule an office visit with Dr. Lorelei Pont for his back.

## 2022-05-31 NOTE — Telephone Encounter (Signed)
Appointment scheduled 06/02/22 at 9:20 am with Dr. Lorelei Pont.

## 2022-05-31 NOTE — Telephone Encounter (Signed)
-----   Message from Tonia Ghent, MD sent at 05/31/2022  7:15 AM EST ----- Can you please call this kind patient to get him set up for a visit with you and Copland?  Many thanks.  Brigitte Pulse

## 2022-06-02 ENCOUNTER — Ambulatory Visit (INDEPENDENT_AMBULATORY_CARE_PROVIDER_SITE_OTHER)
Admission: RE | Admit: 2022-06-02 | Discharge: 2022-06-02 | Disposition: A | Discharge status: Home / Self Care | Payer: 59 | Source: Ambulatory Visit | Attending: Family Medicine | Admitting: Family Medicine

## 2022-06-02 ENCOUNTER — Encounter: Payer: Self-pay | Admitting: Family Medicine

## 2022-06-02 ENCOUNTER — Ambulatory Visit (INDEPENDENT_AMBULATORY_CARE_PROVIDER_SITE_OTHER): Payer: 59 | Admitting: Family Medicine

## 2022-06-02 VITALS — BP 150/80 | HR 87 | Temp 98.1°F | Ht 64.0 in | Wt 226.4 lb

## 2022-06-02 DIAGNOSIS — M549 Dorsalgia, unspecified: Secondary | ICD-10-CM | POA: Diagnosis not present

## 2022-06-02 DIAGNOSIS — G8929 Other chronic pain: Secondary | ICD-10-CM

## 2022-06-02 MED ORDER — PREDNISONE 20 MG PO TABS: ORAL_TABLET | ORAL | 0 refills | Status: DC

## 2022-06-02 MED ORDER — BACLOFEN 5 MG PO TABS
5.0000 mg | ORAL_TABLET | Freq: Three times a day (TID) | ORAL | 1 refills | Status: DC | PRN
Start: 2022-06-02 — End: 2022-07-19

## 2022-06-02 NOTE — Progress Notes (Signed)
Tyrone Wiley T. Tyrone Sproule, MD, CAQ Sports Medicine [facility] at [facility] [address] [place], [address]  Phone: [phone]  FAX: [phone]  Tyrone Wiley - 62 y.o. male  MRN [id number]  Date of Birth: [birth year]  Date: 06/02/2022  PCP: Joaquim Nam, MD  Referral: Joaquim Nam, MD  Chief Complaint  Patient presents with  . Back Pain   Subjective:   Tyrone Wiley is a 62 y.o. very pleasant male patient with Body mass index is 38.86 kg/m. who presents with the following:  Has had some back problems off and on for years.  A year later, has come back.   Since New Years, he has been in a lot of pain.  Never comfortable.  Going from sitting to standing is bothering him a lot.  In the past, he has been stable with his back pain intermittently for years, but he does not have flareups once a year to once every couple of years.  Typically these resolve fairly quickly on their own.  Right now, he has had persistent pain for roughly 2 months that has been recalcitrant, and is also worse than it has ever been before.  Yesterday took a walk that was painful.  Standing was really difficult.  He does not endorse any radiculopathy, numbness, tingling currently.  He has no weakness.  Noticed a big change in his gate.  Balalnce issues right now with his back pain.  Has been going to the chiropractor right now.  Normally, this will help, but it has not helped at all right now.  Has been to Neurosurgery in the past.  Never had pain this long or this bad.    2012 MRI impression: Broad-based, left posterolateral predominant disc herniation at L4-  5 with a caudally migrated fragment in the left lateral recess  quite likely to compress the left L5 nerve root.   Shallow broad-based disc herniation at L3-4 more prominent towards  the right.  Narrowing of the right lateral recess that could effect  the right L4 nerve root.   Chronic disc degeneration and  facet degeneration at L5-S1 with  foraminal narrowing on the right that could effect the L5 nerve  root.   Original Report Authenticated By: Thomasenia Sales, M.D.   Review of Systems is noted in the HPI, as appropriate  Objective:   BP (!) 150/80   Pulse 87   Temp 98.1 F (36.7 C) (Temporal)   Ht 5\' 4"  (1.626 m)   Wt 226 lb 6 oz (102.7 kg)   SpO2 96%   BMI 38.86 kg/m   GEN: No acute distress; alert,appropriate. PULM: Breathing comfortably in no respiratory distress PSYCH: Normally interactive.    Range of motion at  the waist: Flexion, extension, lateral bending and rotation: Limited to 50 degrees of forward flexion.  Extension also causes pain, but lateral bending and rotational maneuvers are more preserved.  No echymosis or edema Rises to examination table with mild difficulty Gait: minimally antalgic  Inspection/Deformity: N Paraspinus Tenderness: L4-S1 bilaterally  B Ankle Dorsiflexion (L5,4): 5/5 B Great Toe Dorsiflexion (L5,4): 5/5 Heel Walk (L5): WNL Toe Walk (S1): WNL Rise/Squat (L4): WNL, mild pain  SENSORY B Medial Foot (L4): WNL B Dorsum (L5): WNL B Lateral (S1): WNL Light Touch: WNL Pinprick: WNL  REFLEXES Knee (L4): 2+ Ankle (S1): 2+  B SLR, seated: neg B SLR, supine: neg B FABER: Back pain B Reverse FABER: Discomfort B Greater Troch:  NT B Log Roll: neg B Sciatic Notch: NT   Laboratory and Imaging Data:  Assessment and Plan:     ICD-10-CM   1. Chronic back pain greater than 3 months duration  M54.9 DG Lumbar Spine Complete   G89.29 Ambulatory referral to Physical Therapy    MR Lumbar Spine Wo Contrast    2. Acute back pain, unspecified back location, unspecified back pain laterality  M54.9 DG Lumbar Spine Complete    Ambulatory referral to Physical Therapy    MR Lumbar Spine Wo Contrast     Acute on chronic multiyear back pain with a 62-month exacerbation that has been unrelenting right now.  He does have a known history of  multilevel degenerative disc disease with a very significant L4-5 disc herniation proven on MRI in 2012.  Failure of conservative management including NSAIDs, muscle relaxants, current use of prednisone, history of physical therapy, multiple treatments per chiropractor.  Obtain an MRI of the lumbar spine to evaluate for spinal stenosis, foraminal stenosis, disc herniation.  Evaluate for cord impingement.  Medication Management during today's office visit: Meds ordered this encounter  Medications  . Baclofen 5 MG TABS    Sig: Take 1 tablet (5 mg total) by mouth 3 (three) times daily as needed.    Dispense:  60 tablet    Refill:  1  . predniSONE (DELTASONE) 20 MG tablet    Sig: 2 tabs po daily for 5 days, then 1 tab po daily for 5 days    Dispense:  15 tablet    Refill:  0   Medications Discontinued During This Encounter  Medication Reason  . baclofen 5 MG TABS Reorder    Orders placed today for conditions managed today: Orders Placed This Encounter  Procedures  . DG Lumbar Spine Complete  . MR Lumbar Spine Wo Contrast  . Ambulatory referral to Physical Therapy    Disposition: No follow-ups on file.  [employer] One speech-to-text software was used for transcription in this dictation.  Possible transcriptional errors can occur using Animal nutritionist.   Signed,  Elpidio Galea. Nimisha Rathel, MD   Outpatient Encounter Medications as of 06/02/2022  Medication Sig  . augmented betamethasone dipropionate (DIPROLENE-AF) 0.05 % ointment Apply topically daily.  . predniSONE (DELTASONE) 20 MG tablet 2 tabs po daily for 5 days, then 1 tab po daily for 5 days  . Baclofen 5 MG TABS Take 1 tablet (5 mg total) by mouth 3 (three) times daily as needed.  . fluticasone (FLONASE) 50 MCG/ACT nasal spray Place into both nostrils 3 (three) times a week.  . sildenafil (REVATIO) 20 MG tablet Take 1-5 tablets (20-100 mg total) by mouth daily as needed.  Marcy Panning 90 MG/ML SOSY injection Inject into the  skin. Every 12 weeks.  . traZODone (DESYREL) 50 MG tablet TAKE 0.5-1 TABLETS BY MOUTH AT BEDTIME AS NEEDED FOR SLEEP.  . [DISCONTINUED] baclofen 5 MG TABS Take 5 mg by mouth 3 (three) times daily as needed for muscle spasms.   No facility-administered encounter medications on file as of 06/02/2022.

## 2022-06-03 ENCOUNTER — Encounter: Payer: Self-pay | Admitting: Family Medicine

## 2022-06-07 ENCOUNTER — Encounter: Payer: Self-pay | Admitting: Family Medicine

## 2022-06-19 ENCOUNTER — Ambulatory Visit
Admission: RE | Admit: 2022-06-19 | Discharge: 2022-06-19 | Disposition: A | Discharge status: Home / Self Care | Payer: 59 | Source: Ambulatory Visit | Attending: Family Medicine | Admitting: Family Medicine

## 2022-06-19 DIAGNOSIS — G8929 Other chronic pain: Secondary | ICD-10-CM

## 2022-06-19 DIAGNOSIS — M549 Dorsalgia, unspecified: Secondary | ICD-10-CM

## 2022-06-21 ENCOUNTER — Other Ambulatory Visit: Payer: Self-pay | Admitting: Family Medicine

## 2022-06-21 DIAGNOSIS — M5116 Intervertebral disc disorders with radiculopathy, lumbar region: Secondary | ICD-10-CM

## 2022-06-21 DIAGNOSIS — M48062 Spinal stenosis, lumbar region with neurogenic claudication: Secondary | ICD-10-CM

## 2022-06-22 ENCOUNTER — Encounter: Payer: Self-pay | Admitting: Family Medicine

## 2022-07-15 ENCOUNTER — Encounter: Payer: Self-pay | Admitting: Family Medicine

## 2022-07-16 ENCOUNTER — Other Ambulatory Visit: Payer: Self-pay | Admitting: Family Medicine

## 2022-07-19 NOTE — Telephone Encounter (Signed)
Last office visit 06/02/2022 with Dr. Lorelei Pont for chondric back pain.  Last refilled 06/02/22 for #60 with 1 refill.  Next Appt: CPE with PCP on 11/12/2022.

## 2022-08-05 ENCOUNTER — Ambulatory Visit: Payer: 59 | Admitting: Neurosurgery

## 2022-08-13 ENCOUNTER — Encounter: Payer: Self-pay | Admitting: Neurosurgery

## 2022-08-16 NOTE — Progress Notes (Unsigned)
Referring Physician:  Hannah Beat, MD [address] [place],  [place] [address]  Primary Physician:  Tyrone Nam, MD  History of Present Illness: 08/17/2022 [name] is here today with a chief complaint of back discomfort and some left leg pain.  He is also having some problems with numbness and discomfort when he walks.  He can walk a quarter to half mile before he has to sit down due to discomfort.  He has numbness and tingling in his left foot near his great toe.  He did physical therapy recently which helped him significantly.  His wife notes that he does use a cart to walk.  He has to bend forward at times for relief. Bowel/Bladder Dysfunction: none  Conservative measures:  Physical therapy:   released from pt 08/06/2022 Multimodal medical therapy including regular antiinflammatories: , aleve and occ vicodin  Injections: denies epidural steroid injections   Past Surgery: denies   Tyrone Wiley has no symptoms of cervical myelopathy.  The symptoms are causing a significant impact on the patient's life.   I have utilized the care everywhere function in epic to review the outside records available from external health systems.  Review of Systems:  A 10 point review of systems is negative, except for the pertinent positives and negatives detailed in the HPI.  Past Medical History: Past Medical History:  Diagnosis Date   Allergy    Back pain    with prev L4/L5 bulge   Heart murmur    History of chicken pox    Hyperlipidemia    Hypertension    Mononucleosis 1981   Hospitalized   Psoriasis    with joint pain   Renal stone     Past Surgical History: Past Surgical History:  Procedure Laterality Date   biopsy for fertility     TONSILLECTOMY AND ADENOIDECTOMY  1967    Allergies: Allergies as of 08/17/2022 - Review Complete 08/17/2022  Allergen Reaction Noted   Flexeril [cyclobenzaprine hcl]  11/16/2010   Lipitor [atorvastatin calcium]   11/20/2018   Mometasone furoate  01/20/2010    Medications:  Current Outpatient Medications:    augmented betamethasone dipropionate (DIPROLENE-AF) 0.05 % ointment, Apply topically daily., Disp: , Rfl:    Baclofen 5 MG TABS, TAKE 1 TABLET BY MOUTH 3 TIMES DAILY AS NEEDED., Disp: 60 tablet, Rfl: 1   fluticasone (FLONASE) 50 MCG/ACT nasal spray, Place into both nostrils 3 (three) times a week., Disp: , Rfl:    predniSONE (DELTASONE) 20 MG tablet, 2 tabs po daily for 5 days, then 1 tab po daily for 5 days, Disp: 15 tablet, Rfl: 0   sildenafil (REVATIO) 20 MG tablet, Take 1-5 tablets (20-100 mg total) by mouth daily as needed., Disp: 50 tablet, Rfl: 12   STELARA 90 MG/ML SOSY injection, Inject into the skin. Every 12 weeks., Disp: , Rfl:    traZODone (DESYREL) 50 MG tablet, TAKE 0.5-1 TABLETS BY MOUTH AT BEDTIME AS NEEDED FOR SLEEP., Disp: 90 tablet, Rfl: 1  Social History: Social History   Tobacco Use   Smoking status: Never   Smokeless tobacco: Never  Vaping Use   Vaping Use: Never used  Substance Use Topics   Alcohol use: Yes    Alcohol/week: 3.0 standard drinks of alcohol    Types: 3 Standard drinks or equivalent per week    Comment: 3 to 4 drinks per week   Drug use: No    Family Medical History: Family History  Problem Relation Age of Onset   Hypertension Mother    Cancer Mother        breast cancer   Colon polyps Mother    Arthritis Father    Cancer Father        throat cancer   Hyperlipidemia Father    Seizures Father        h/o seizure-single event in distant past   Dementia Father    Melanoma Sister    Heart disease Maternal Grandmother        MI   Heart disease Maternal Grandfather        MI   Hypertension Paternal Grandmother    Hypertension Paternal Grandfather    Alcohol abuse Other    Arthritis Other    Diabetes Other        Family History    Hyperlipidemia Other        Parents, GP's   Sudden death Other        GP's   Prostate cancer Neg Hx     Esophageal cancer Neg Hx    Stomach cancer Neg Hx    Colon cancer Neg Hx    Rectal cancer Neg Hx     Physical Examination: Vitals:   08/17/22 1424  BP: (!) 160/76  Pulse: 94  SpO2: 98%    General: Patient is well developed, well nourished, calm, collected, and in no apparent distress. Attention to examination is appropriate.  Neck:   Supple.  Full range of motion.  Respiratory: Patient is breathing without any difficulty.   NEUROLOGICAL:     Awake, alert, oriented to person, place, and time.  Speech is clear and fluent.   Cranial Nerves: Pupils equal round and reactive to light.  Facial tone is symmetric.  Facial sensation is symmetric. Shoulder shrug is symmetric. Tongue protrusion is midline.  There is no pronator drift.  ROM of spine: full.    Strength: Side Biceps Triceps Deltoid Interossei Grip Wrist Ext. Wrist Flex.  R 5 5 5 5 5 5 5   L 5 5 5 5 5 5 5    Side Iliopsoas Quads Hamstring PF DF EHL  R 5 5 5 5 5 5   L 5 5 5 5 5 5    Reflexes are 1+ and symmetric at the biceps, triceps, brachioradialis, patella and achilles.   Hoffman's is absent.   Bilateral upper and lower extremity sensation is intact to light touch.    No evidence of dysmetria noted.  Gait is normal.     Medical Decision Making  Imaging: MRI L spine 06/19/2022 L1-L2: No significant disc bulge. Mild bilateral facet arthropathy. No foraminal or central canal stenosis.   L2-L3: Broad-based disc bulge with a large central/right paracentral disc protrusion measuring 1.3 x 0.9 x 1.3 cm with mass effect on the right intraspinal nerve roots and overall severe spinal stenosis. Moderate bilateral facet arthropathy. No foraminal stenosis.   L3-L4: Broad-based disc bulge. Mild bilateral facet arthropathy. No foraminal or central canal stenosis.   L4-L5: No disc protrusion. Mild right foraminal stenosis. No left foraminal stenosis. Moderate bilateral facet arthropathy. No spinal stenosis.   L5-S1: No  disc protrusion. Prominent right foraminal disc osteophyte complex. Moderate right foraminal stenosis. Mild left foraminal stenosis. No spinal stenosis. Moderate right and mild left facet arthropathy. MPRESSION: 1. At L2-3 there is a broad-based disc bulge with a large central/right paracentral disc protrusion measuring 1.3 x 0.9 x 1.3 cm with mass effect on the right intraspinal nerve roots and  overall severe spinal stenosis. 2. Lumbar spine spondylosis as described above. 3. No acute osseous injury of the lumbar spine.     Electronically Signed   By: Elige Ko M.D.   On: 06/21/2022 10:25  I have personally reviewed the images and agree with the above interpretation.  Assessment and Plan: Mr. Patmon is a pleasant 62 y.o. male with neurogenic claudication due to lumbar stenosis at L2-3.  He has a large disc herniation at L2-3 that causes this.  His symptoms are much improved compared to what they were previously.  We discussed that his symptoms are currently improved, though he does have continued symptoms of neurogenic claudication.  I would like to defer decision on surgical intervention to his next appointment.  I will set up a telephone visit in 4 weeks.  At this point, his day-to-day activities are not significantly impacted so I have not strongly recommended moving forward with surgery until he has significant impact on his day-to-day life.    Thank you for involving me in the care of this patient.      Kerrilynn Derenzo K. Myer Haff MD, [facility] Neurosurgery

## 2022-08-17 ENCOUNTER — Ambulatory Visit (INDEPENDENT_AMBULATORY_CARE_PROVIDER_SITE_OTHER): Payer: 59 | Admitting: Neurosurgery

## 2022-08-17 ENCOUNTER — Encounter: Payer: Self-pay | Admitting: Neurosurgery

## 2022-08-17 VITALS — BP 160/76 | HR 94 | Ht 64.0 in | Wt 230.0 lb

## 2022-08-17 DIAGNOSIS — M5126 Other intervertebral disc displacement, lumbar region: Secondary | ICD-10-CM

## 2022-08-17 DIAGNOSIS — M48062 Spinal stenosis, lumbar region with neurogenic claudication: Secondary | ICD-10-CM | POA: Diagnosis not present

## 2022-08-26 ENCOUNTER — Other Ambulatory Visit: Payer: Self-pay | Admitting: Family Medicine

## 2022-09-21 ENCOUNTER — Ambulatory Visit (INDEPENDENT_AMBULATORY_CARE_PROVIDER_SITE_OTHER): Payer: 59 | Admitting: Neurosurgery

## 2022-09-21 DIAGNOSIS — M48062 Spinal stenosis, lumbar region with neurogenic claudication: Secondary | ICD-10-CM

## 2022-09-21 NOTE — Progress Notes (Signed)
Referring Physician:  Joaquim Nam, MD [address] [place],  [place] [address]  Primary Physician:  Joaquim Nam, MD  History of Present Illness: 09/21/2022 He is doing extremely well.  He feels that he is continue to improve.  He has been going to the gym.  His symptoms have continued to improve since I last saw him.  He is very pleased.  08/17/2022 Mr. Kendle Bruster is here today with a chief complaint of back discomfort and some left leg pain.  He is also having some problems with numbness and discomfort when he walks.  He can walk a quarter to half mile before he has to sit down due to discomfort.  He has numbness and tingling in his left foot near his great toe.  He did physical therapy recently which helped him significantly.  His wife notes that he does use a cart to walk.  He has to bend forward at times for relief. Bowel/Bladder Dysfunction: none  Conservative measures:  Physical therapy:   released from pt 08/06/2022 Multimodal medical therapy including regular antiinflammatories: , aleve and occ vicodin  Injections: denies epidural steroid injections   Past Surgery: denies   DERELLE SEELMAN has no symptoms of cervical myelopathy.  The symptoms are causing a significant impact on the patient's life.   I have utilized the care everywhere function in epic to review the outside records available from external health systems.  Review of Systems:  A 10 point review of systems is negative, except for the pertinent positives and negatives detailed in the HPI.  Past Medical History: Past Medical History:  Diagnosis Date   Allergy    Back pain    with prev L4/L5 bulge   Heart murmur    History of chicken pox    Hyperlipidemia    Hypertension    Mononucleosis 1981   Hospitalized   Psoriasis    with joint pain   Renal stone     Past Surgical History: Past Surgical History:  Procedure Laterality Date   biopsy for fertility     TONSILLECTOMY AND  ADENOIDECTOMY  1967    Allergies: Allergies as of 09/21/2022 - Review Complete 08/17/2022  Allergen Reaction Noted   Flexeril [cyclobenzaprine hcl]  11/16/2010   Lipitor [atorvastatin calcium]  11/20/2018   Mometasone furoate  01/20/2010    Medications:  Current Outpatient Medications:    augmented betamethasone dipropionate (DIPROLENE-AF) 0.05 % ointment, Apply topically daily., Disp: , Rfl:    Baclofen 5 MG TABS, TAKE 1 TABLET BY MOUTH 3 TIMES DAILY AS NEEDED., Disp: 60 tablet, Rfl: 1   fluticasone (FLONASE) 50 MCG/ACT nasal spray, Place into both nostrils 3 (three) times a week., Disp: , Rfl:    predniSONE (DELTASONE) 20 MG tablet, 2 tabs po daily for 5 days, then 1 tab po daily for 5 days, Disp: 15 tablet, Rfl: 0   sildenafil (REVATIO) 20 MG tablet, TAKE 1 TO 5 TABLETS BY MOUTH DAILY AS NEEDED, Disp: 50 tablet, Rfl: 12   STELARA 90 MG/ML SOSY injection, Inject into the skin. Every 12 weeks., Disp: , Rfl:    traZODone (DESYREL) 50 MG tablet, TAKE 0.5-1 TABLETS BY MOUTH AT BEDTIME AS NEEDED FOR SLEEP., Disp: 90 tablet, Rfl: 1  Social History: Social History   Tobacco Use   Smoking status: Never   Smokeless tobacco: Never  Vaping Use   Vaping Use: Never used  Substance Use Topics   Alcohol use: Yes  Alcohol/week: 3.0 standard drinks of alcohol    Types: 3 Standard drinks or equivalent per week    Comment: 3 to 4 drinks per week   Drug use: No    Family Medical History: Family History  Problem Relation Age of Onset   Hypertension Mother    Cancer Mother        breast cancer   Colon polyps Mother    Arthritis Father    Cancer Father        throat cancer   Hyperlipidemia Father    Seizures Father        h/o seizure-single event in distant past   Dementia Father    Melanoma Sister    Heart disease Maternal Grandmother        MI   Heart disease Maternal Grandfather        MI   Hypertension Paternal Grandmother    Hypertension Paternal Grandfather    Alcohol  abuse Other    Arthritis Other    Diabetes Other        Family History    Hyperlipidemia Other        Parents, GP's   Sudden death Other        GP's   Prostate cancer Neg Hx    Esophageal cancer Neg Hx    Stomach cancer Neg Hx    Colon cancer Neg Hx    Rectal cancer Neg Hx     Physical Examination: There were no vitals filed for this visit.  Telephone visit today   Medical Decision Making  Imaging: MRI L spine 06/19/2022 L1-L2: No significant disc bulge. Mild bilateral facet arthropathy. No foraminal or central canal stenosis.   L2-L3: Broad-based disc bulge with a large central/right paracentral disc protrusion measuring 1.3 x 0.9 x 1.3 cm with mass effect on the right intraspinal nerve roots and overall severe spinal stenosis. Moderate bilateral facet arthropathy. No foraminal stenosis.   L3-L4: Broad-based disc bulge. Mild bilateral facet arthropathy. No foraminal or central canal stenosis.   L4-L5: No disc protrusion. Mild right foraminal stenosis. No left foraminal stenosis. Moderate bilateral facet arthropathy. No spinal stenosis.   L5-S1: No disc protrusion. Prominent right foraminal disc osteophyte complex. Moderate right foraminal stenosis. Mild left foraminal stenosis. No spinal stenosis. Moderate right and mild left facet arthropathy. MPRESSION: 1. At L2-3 there is a broad-based disc bulge with a large central/right paracentral disc protrusion measuring 1.3 x 0.9 x 1.3 cm with mass effect on the right intraspinal nerve roots and overall severe spinal stenosis. 2. Lumbar spine spondylosis as described above. 3. No acute osseous injury of the lumbar spine.     Electronically Signed   By: Elige Ko M.D.   On: 06/21/2022 10:25  I have personally reviewed the images and agree with the above interpretation.  Assessment and Plan: Mr. Hull is a pleasant 62 y.o. male with neurogenic claudication due to lumbar stenosis at L2-3.  He has a large disc  herniation at L2-3 that causes this.  His symptoms have continued to improve over time.  I am very pleased to hear about his improvement.  He will continue to increase his activity over time.  If his symptoms worsen or recur, we will be happy to see him back in clinic.  This visit was performed via telephone.  Patient location: home Provider location: office  I spent a total of 2 minutes non-face-to-face activities for this visit on the date of this encounter including review of current  clinical condition and response to treatment.  The patient is aware of and accepts the limits of this telehealth visit.  Thank you for involving me in the care of this patient.      Tanisa Lagace K. Myer Haff MD, [facility] Neurosurgery

## 2022-10-04 ENCOUNTER — Other Ambulatory Visit: Payer: Self-pay | Admitting: Family Medicine

## 2022-10-25 ENCOUNTER — Other Ambulatory Visit: Payer: Self-pay | Admitting: Family Medicine

## 2022-10-25 DIAGNOSIS — E785 Hyperlipidemia, unspecified: Secondary | ICD-10-CM

## 2022-10-25 DIAGNOSIS — Z125 Encounter for screening for malignant neoplasm of prostate: Secondary | ICD-10-CM

## 2022-10-27 ENCOUNTER — Other Ambulatory Visit: Payer: Self-pay | Admitting: Family Medicine

## 2022-10-27 NOTE — Telephone Encounter (Signed)
Last office visit 06/02/22 with Dr. Patsy Lager for Chronic Back Pain.  Last refilled 07/19/22 for #60 with 1 refill.  Next Appt: CPE with PCP 11/12/2022.

## 2022-11-05 ENCOUNTER — Other Ambulatory Visit (INDEPENDENT_AMBULATORY_CARE_PROVIDER_SITE_OTHER): Payer: 59

## 2022-11-05 DIAGNOSIS — Z125 Encounter for screening for malignant neoplasm of prostate: Secondary | ICD-10-CM

## 2022-11-05 DIAGNOSIS — E785 Hyperlipidemia, unspecified: Secondary | ICD-10-CM

## 2022-11-05 NOTE — Addendum Note (Signed)
Addended by: Shon Millet on: 11/05/2022 09:25 AM   Modules accepted: Orders

## 2022-11-06 LAB — COMPREHENSIVE METABOLIC PANEL
AG Ratio: 1.5 (calc) (ref 1.0–2.5)
ALT: 9 U/L (ref 9–46)
AST: 9 U/L — ABNORMAL LOW (ref 10–35)
Albumin: 4 g/dL (ref 3.6–5.1)
Alkaline phosphatase (APISO): 44 U/L (ref 35–144)
BUN: 10 mg/dL (ref 7–25)
CO2: 25 mmol/L (ref 20–32)
Calcium: 8.9 mg/dL (ref 8.6–10.3)
Chloride: 103 mmol/L (ref 98–110)
Creat: 1 mg/dL (ref 0.70–1.35)
Globulin: 2.6 g/dL (calc) (ref 1.9–3.7)
Glucose, Bld: 108 mg/dL — ABNORMAL HIGH (ref 65–99)
Potassium: 4.6 mmol/L (ref 3.5–5.3)
Sodium: 139 mmol/L (ref 135–146)
Total Bilirubin: 0.8 mg/dL (ref 0.2–1.2)
Total Protein: 6.6 g/dL (ref 6.1–8.1)

## 2022-11-06 LAB — LIPID PANEL
Cholesterol: 193 mg/dL (ref <200)
HDL: 42 mg/dL (ref >=40)
LDL Cholesterol (Calc): 129 mg/dL (calc) — ABNORMAL HIGH
Non-HDL Cholesterol (Calc): 151 mg/dL (calc) — ABNORMAL HIGH (ref <130)
Total CHOL/HDL Ratio: 4.6 (calc) (ref <5.0)
Triglycerides: 114 mg/dL (ref <150)

## 2022-11-06 LAB — PSA: PSA: 0.61 ng/mL (ref <=4.00)

## 2022-11-12 ENCOUNTER — Ambulatory Visit (INDEPENDENT_AMBULATORY_CARE_PROVIDER_SITE_OTHER): Payer: 59 | Admitting: Family Medicine

## 2022-11-12 ENCOUNTER — Encounter: Payer: Self-pay | Admitting: Family Medicine

## 2022-11-12 VITALS — BP 124/60 | HR 92 | Temp 98.3°F | Ht 64.0 in | Wt 232.0 lb

## 2022-11-12 DIAGNOSIS — Z0000 Encounter for general adult medical examination without abnormal findings: Secondary | ICD-10-CM

## 2022-11-12 DIAGNOSIS — G4700 Insomnia, unspecified: Secondary | ICD-10-CM

## 2022-11-12 DIAGNOSIS — N529 Male erectile dysfunction, unspecified: Secondary | ICD-10-CM

## 2022-11-12 DIAGNOSIS — L409 Psoriasis, unspecified: Secondary | ICD-10-CM

## 2022-11-12 DIAGNOSIS — J309 Allergic rhinitis, unspecified: Secondary | ICD-10-CM

## 2022-11-12 DIAGNOSIS — M545 Low back pain, unspecified: Secondary | ICD-10-CM

## 2022-11-12 DIAGNOSIS — Z7189 Other specified counseling: Secondary | ICD-10-CM

## 2022-11-12 MED ORDER — TRAZODONE HCL 50 MG PO TABS: 25.0000 mg | ORAL_TABLET | Freq: Every evening | ORAL | 3 refills | Status: DC | PRN

## 2022-11-12 MED ORDER — BACLOFEN 5 MG PO TABS: 1.0000 | ORAL_TABLET | Freq: Three times a day (TID) | ORAL | 3 refills | Status: AC | PRN

## 2022-11-12 MED ORDER — FLUTICASONE PROPIONATE 50 MCG/ACT NA SUSP: 1.0000 | NASAL | Status: AC

## 2022-11-12 MED ORDER — TRAZODONE HCL 50 MG PO TABS: 25.0000 mg | ORAL_TABLET | Freq: Every evening | ORAL | 1 refills | Status: DC | PRN

## 2022-11-12 NOTE — Patient Instructions (Signed)
Don't change your meds.  Update me as needed.  Take care.  Glad to see you. Thanks for your effort.

## 2022-11-12 NOTE — Assessment & Plan Note (Signed)
Trazodone helped with insomnia.  Taking 50mg  at night.  He noted sleep quality improved.  Continue as is.

## 2022-11-12 NOTE — Assessment & Plan Note (Signed)
Baclofen helped, used prn.  He is getting a new bed and that may help with back soreness at night.  Update me as needed.

## 2022-11-12 NOTE — Assessment & Plan Note (Signed)
Living will d/w pt. Would have wife designated if patient were incapacitated.  

## 2022-11-12 NOTE — Assessment & Plan Note (Signed)
Tetanus 2019   Flu shot done prev PNA not due.  Shingles prev done.   covid vaccine 2021 Colonoscopy 2023.  FH colon polyps but not cancer, mother dx'd at 86.   Living will d/w pt. Would have wife designated if patient were incapacitated.   PSA wnl 2024 Diet and exercise d/w pt.  Doing tai chi and yoga, also rowing.   Labs d/w pt.   HIV and HCV done per derm prev

## 2022-11-12 NOTE — Progress Notes (Signed)
CPE- See plan.  Routine anticipatory guidance given to patient.  See health maintenance.  The possibility exists that previously documented standard health maintenance information may have been brought forward from a previous encounter into this note.  If needed, that same information has been updated to reflect the current situation based on today's encounter.    Tetanus 2019   Flu shot done prev PNA not due.  Shingles prev done.   covid vaccine 2021 Colonoscopy 2023.  FH colon polyps but not cancer, mother dx'd at 71.   Living will d/w pt. Would have wife designated if patient were incapacitated.   PSA wnl 2024 Diet and exercise d/w pt.  Doing tai chi and yoga, also rowing.   Labs d/w pt.   HIV and HCV done per derm prev    ED improved with sildenafil w/o ADE on med.     Psoriasis/psoriatic arthritis.  Still on stelara.  Rare outbreak.    Trazodone helped with insomnia.  Taking 50mg  at night.  He noted sleep quality improved.    Baclofen helped, used prn.  He is getting a new bed and that may help with back soreness at night.    Flonase helped in the spring with seasonal allergies.   PMH and SH reviewed  Meds, vitals, and allergies reviewed.   ROS: Per HPI.  Unless specifically indicated otherwise in HPI, the patient denies:  General: fever. Eyes: acute vision changes ENT: sore throat Cardiovascular: chest pain Respiratory: SOB GI: vomiting GU: dysuria Musculoskeletal: acute back pain Derm: acute rash Neuro: acute motor dysfunction Psych: worsening mood Endocrine: polydipsia Heme: bleeding Allergy: hayfever  GEN: nad, alert and oriented HEENT: mucous membranes moist, TM wnl B NECK: supple w/o LA CV: rrr. PULM: ctab, no inc wob ABD: soft, +bs EXT: no edema SKIN: no acute rash

## 2022-11-12 NOTE — Assessment & Plan Note (Signed)
ED improved with sildenafil w/o ADE on med.   Continue as is.

## 2022-11-12 NOTE — Assessment & Plan Note (Signed)
No flare currently.  Per derm.

## 2022-11-12 NOTE — Assessment & Plan Note (Signed)
Flonase prn, OTC

## 2022-12-13 ENCOUNTER — Other Ambulatory Visit: Payer: Self-pay | Admitting: Medical Genetics

## 2022-12-13 DIAGNOSIS — Z006 Encounter for examination for normal comparison and control in clinical research program: Secondary | ICD-10-CM

## 2023-01-03 ENCOUNTER — Encounter: Payer: Self-pay | Admitting: Family Medicine

## 2023-11-06 ENCOUNTER — Other Ambulatory Visit: Payer: Self-pay | Admitting: Family Medicine

## 2023-11-06 DIAGNOSIS — E785 Hyperlipidemia, unspecified: Secondary | ICD-10-CM

## 2023-11-06 DIAGNOSIS — Z125 Encounter for screening for malignant neoplasm of prostate: Secondary | ICD-10-CM

## 2023-11-07 ENCOUNTER — Other Ambulatory Visit (INDEPENDENT_AMBULATORY_CARE_PROVIDER_SITE_OTHER): Payer: 59

## 2023-11-07 DIAGNOSIS — Z125 Encounter for screening for malignant neoplasm of prostate: Secondary | ICD-10-CM | POA: Diagnosis not present

## 2023-11-07 DIAGNOSIS — E785 Hyperlipidemia, unspecified: Secondary | ICD-10-CM

## 2023-11-07 LAB — COMPREHENSIVE METABOLIC PANEL WITH GFR
ALT: 11 U/L (ref 0–53)
AST: 13 U/L (ref 0–37)
Albumin: 4.2 g/dL (ref 3.5–5.2)
Alkaline Phosphatase: 44 U/L (ref 39–117)
BUN: 17 mg/dL (ref 6–23)
CO2: 26 meq/L (ref 19–32)
Calcium: 8.9 mg/dL (ref 8.4–10.5)
Chloride: 101 meq/L (ref 96–112)
Creatinine, Ser: 1.01 mg/dL (ref 0.40–1.50)
GFR: 79.21 mL/min (ref >60.00)
Glucose, Bld: 102 mg/dL — ABNORMAL HIGH (ref 70–99)
Potassium: 4.8 meq/L (ref 3.5–5.1)
Sodium: 136 meq/L (ref 135–145)
Total Bilirubin: 0.8 mg/dL (ref 0.2–1.2)
Total Protein: 6.6 g/dL (ref 6.0–8.3)

## 2023-11-07 LAB — LIPID PANEL
Cholesterol: 196 mg/dL (ref 0–200)
HDL: 46.5 mg/dL (ref >39.00)
LDL Cholesterol: 128 mg/dL — ABNORMAL HIGH (ref 0–99)
NonHDL: 149.95
Total CHOL/HDL Ratio: 4
Triglycerides: 109 mg/dL (ref 0.0–149.0)
VLDL: 21.8 mg/dL (ref 0.0–40.0)

## 2023-11-07 LAB — PSA: PSA: 0.68 ng/mL (ref 0.10–4.00)

## 2023-11-13 ENCOUNTER — Ambulatory Visit: Payer: Self-pay | Admitting: Family Medicine

## 2023-11-14 ENCOUNTER — Ambulatory Visit (INDEPENDENT_AMBULATORY_CARE_PROVIDER_SITE_OTHER): Payer: 59 | Admitting: Family Medicine

## 2023-11-14 ENCOUNTER — Encounter: Payer: Self-pay | Admitting: Family Medicine

## 2023-11-14 VITALS — BP 140/80 | HR 94 | Temp 99.0°F | Ht 64.65 in | Wt 240.4 lb

## 2023-11-14 DIAGNOSIS — Z0000 Encounter for general adult medical examination without abnormal findings: Secondary | ICD-10-CM

## 2023-11-14 DIAGNOSIS — G4700 Insomnia, unspecified: Secondary | ICD-10-CM

## 2023-11-14 DIAGNOSIS — L409 Psoriasis, unspecified: Secondary | ICD-10-CM

## 2023-11-14 DIAGNOSIS — N529 Male erectile dysfunction, unspecified: Secondary | ICD-10-CM

## 2023-11-14 DIAGNOSIS — Z7189 Other specified counseling: Secondary | ICD-10-CM

## 2023-11-14 DIAGNOSIS — M545 Low back pain, unspecified: Secondary | ICD-10-CM

## 2023-11-14 MED ORDER — SILDENAFIL CITRATE 20 MG PO TABS: 20.0000 mg | ORAL_TABLET | Freq: Every day | ORAL | 12 refills | Status: AC | PRN

## 2023-11-14 MED ORDER — TRAZODONE HCL 50 MG PO TABS: 25.0000 mg | ORAL_TABLET | Freq: Every evening | ORAL | 3 refills | Status: AC | PRN

## 2023-11-14 NOTE — Patient Instructions (Signed)
 Don't change your meds for now.  I would get a flu shot each fall.   Take care.  Glad to see you. Update me as needed.

## 2023-11-14 NOTE — Progress Notes (Unsigned)
 CPE- See plan.  Routine anticipatory guidance given to patient.  See health maintenance.  The possibility exists that previously documented standard health maintenance information may have been brought forward from a previous encounter into this note.  If needed, that same information has been updated to reflect the current situation based on today's encounter.    Tetanus 2023 Flu shot done prev PNA d/w pt.   Shingles prev done.   covid vaccine 2021 Colonoscopy 2023.  FH colon polyps but not cancer, mother dx'd at 34.   Living will d/w pt. Would have wife designated if patient were incapacitated.   PSA wnl 2025 Diet and exercise d/w pt.  Doing tai chi and yoga, also rowing.   Labs d/w pt.   HIV and HCV done per derm prev   He can exercise vigorously (lifting/treadmill/rowing) w/o CP.    ED improved with sildenafil  w/o ADE on med.     Psoriasis/psoriatic arthritis.  Still on stelara.  Rare outbreak, one this year, with chronic thenar rash B hands.    Trazodone  helped with insomnia.  Taking 25mg  at night.  He noted sleep quality improved.  He failed cessation.  No ADE on med.     Baclofen  helped, used prn.  He got a new mattress and that helped with back soreness at night.   Flonase  helped in the spring with seasonal allergies.   PMH and SH reviewed  Meds, vitals, and allergies reviewed.   ROS: Per HPI.  Unless specifically indicated otherwise in HPI, the patient denies:  General: fever. Eyes: acute vision changes ENT: sore throat Cardiovascular: chest pain Respiratory: SOB GI: vomiting GU: dysuria Musculoskeletal: acute back pain Derm: acute rash Neuro: acute motor dysfunction Psych: worsening mood Endocrine: polydipsia Heme: bleeding Allergy: hayfever  GEN: nad, alert and oriented HEENT: mucous membranes moist NECK: supple w/o LA CV: rrr. PULM: ctab, no inc wob ABD: soft, +bs EXT: no edema SKIN: no acute rash but chronic bilateral thenar rash noted.

## 2023-11-16 NOTE — Assessment & Plan Note (Signed)
 Baclofen  helped, used prn.  He got a new mattress and that helped with back soreness at night.

## 2023-11-16 NOTE — Assessment & Plan Note (Signed)
 For outside clinic.  I will defer.  He agrees.

## 2023-11-16 NOTE — Assessment & Plan Note (Signed)
 Continue trazodone  at night as is.

## 2023-11-16 NOTE — Assessment & Plan Note (Signed)
ED improved with sildenafil w/o ADE on med.   Continue as is.

## 2023-11-16 NOTE — Assessment & Plan Note (Signed)
Living will d/w pt. Would have wife designated if patient were incapacitated.  

## 2023-11-16 NOTE — Assessment & Plan Note (Signed)
 Tetanus 2023 Flu shot done prev PNA d/w pt.   Shingles prev done.   covid vaccine 2021 Colonoscopy 2023.  FH colon polyps but not cancer, mother dx'd at 61.   Living will d/w pt. Would have wife designated if patient were incapacitated.   PSA wnl 2025 Diet and exercise d/w pt.  Doing tai chi and yoga, also rowing.   Labs d/w pt.   HIV and HCV done per derm prev

## 2024-01-24 ENCOUNTER — Encounter: Payer: Self-pay | Admitting: Family Medicine

## 2024-02-16 ENCOUNTER — Other Ambulatory Visit: Payer: Self-pay | Admitting: Family Medicine

## 2024-02-17 NOTE — Telephone Encounter (Signed)
 Noted. Thanks.

## 2024-02-17 NOTE — Telephone Encounter (Signed)
 Pt returning call. I asked if he's used all 12 refills on current rx since Jul. Pt states he has not. Says he realized called pharmacy with an old rx #. Says he doesn't need refill at this time.   Request denied.

## 2024-02-17 NOTE — Telephone Encounter (Signed)
 Rx sent 11/14/23, #50/12 refills to [facility].   Lvm asking pt to call back. Need to find out if pt has used up all 12 refills since Jul.

## 2024-02-26 ENCOUNTER — Encounter (INDEPENDENT_AMBULATORY_CARE_PROVIDER_SITE_OTHER): Payer: Self-pay
